# Patient Record
Sex: Male | Born: 1935 | Race: White | Hispanic: No | Marital: Married | Smoking: Never smoker
Health system: Southern US, Community
[De-identification: ages and names within clinical notes are randomized; demographics above are authoritative.]

## PROBLEM LIST (undated history)

## (undated) DIAGNOSIS — T8859XA Other complications of anesthesia, initial encounter: Secondary | ICD-10-CM

## (undated) DIAGNOSIS — M19019 Primary osteoarthritis, unspecified shoulder: Secondary | ICD-10-CM

## (undated) DIAGNOSIS — C801 Malignant (primary) neoplasm, unspecified: Secondary | ICD-10-CM

## (undated) DIAGNOSIS — I10 Essential (primary) hypertension: Secondary | ICD-10-CM

## (undated) DIAGNOSIS — K219 Gastro-esophageal reflux disease without esophagitis: Secondary | ICD-10-CM

## (undated) DIAGNOSIS — G459 Transient cerebral ischemic attack, unspecified: Secondary | ICD-10-CM

## (undated) DIAGNOSIS — T4145XA Adverse effect of unspecified anesthetic, initial encounter: Secondary | ICD-10-CM

## (undated) DIAGNOSIS — E785 Hyperlipidemia, unspecified: Secondary | ICD-10-CM

## (undated) HISTORY — PX: KNEE SURGERY: SHX244

## (undated) HISTORY — PX: CATARACT EXTRACTION: SUR2

## (undated) HISTORY — PX: PROSTATECTOMY: SHX69

---

## 2007-11-23 ENCOUNTER — Ambulatory Visit: Payer: Self-pay | Admitting: Cardiology

## 2011-07-26 ENCOUNTER — Encounter (HOSPITAL_COMMUNITY): Payer: Self-pay | Admitting: *Deleted

## 2011-07-26 ENCOUNTER — Emergency Department (HOSPITAL_COMMUNITY): Payer: Medicare Other

## 2011-07-26 ENCOUNTER — Emergency Department (HOSPITAL_COMMUNITY)
Admission: EM | Admit: 2011-07-26 | Discharge: 2011-07-27 | Disposition: A | Discharge status: Home / Self Care | Payer: Medicare Other | Attending: Emergency Medicine | Admitting: Emergency Medicine

## 2011-07-26 DIAGNOSIS — R531 Weakness: Secondary | ICD-10-CM

## 2011-07-26 DIAGNOSIS — G319 Degenerative disease of nervous system, unspecified: Secondary | ICD-10-CM | POA: Insufficient documentation

## 2011-07-26 DIAGNOSIS — R29898 Other symptoms and signs involving the musculoskeletal system: Secondary | ICD-10-CM | POA: Insufficient documentation

## 2011-07-26 DIAGNOSIS — H409 Unspecified glaucoma: Secondary | ICD-10-CM | POA: Insufficient documentation

## 2011-07-26 DIAGNOSIS — R5381 Other malaise: Secondary | ICD-10-CM | POA: Insufficient documentation

## 2011-07-26 DIAGNOSIS — R209 Unspecified disturbances of skin sensation: Secondary | ICD-10-CM | POA: Insufficient documentation

## 2011-07-26 DIAGNOSIS — Z7982 Long term (current) use of aspirin: Secondary | ICD-10-CM | POA: Insufficient documentation

## 2011-07-26 HISTORY — DX: Malignant (primary) neoplasm, unspecified: C80.1

## 2011-07-26 LAB — CBC
HCT: 43.9 % (ref 39.0–52.0)
Hemoglobin: 14.6 g/dL (ref 13.0–17.0)
MCH: 29.6 pg (ref 26.0–34.0)
MCHC: 33.3 g/dL (ref 30.0–36.0)
MCV: 89 fL (ref 78.0–100.0)
Platelets: 223 10*3/uL (ref 150–400)
RBC: 4.93 MIL/uL (ref 4.22–5.81)
RDW: 14.8 % (ref 11.5–15.5)
WBC: 7.4 10*3/uL (ref 4.0–10.5)

## 2011-07-26 LAB — TROPONIN I: Troponin I: <0.3 ng/mL (ref <0.30)

## 2011-07-26 LAB — DIFFERENTIAL
Basophils Absolute: 0 10*3/uL (ref 0.0–0.1)
Basophils Relative: 0 % (ref 0–1)
Eosinophils Absolute: 0.3 10*3/uL (ref 0.0–0.7)
Eosinophils Relative: 4 % (ref 0–5)
Lymphocytes Relative: 28 % (ref 12–46)
Lymphs Abs: 2.1 10*3/uL (ref 0.7–4.0)
Monocytes Absolute: 0.7 10*3/uL (ref 0.1–1.0)
Monocytes Relative: 10 % (ref 3–12)
Neutro Abs: 4.3 10*3/uL (ref 1.7–7.7)
Neutrophils Relative %: 58 % (ref 43–77)

## 2011-07-26 LAB — BASIC METABOLIC PANEL
BUN: 16 mg/dL (ref 6–23)
CO2: 26 mEq/L (ref 19–32)
Calcium: 8.9 mg/dL (ref 8.4–10.5)
Chloride: 103 mEq/L (ref 96–112)
Creatinine, Ser: 1.14 mg/dL (ref 0.50–1.35)
GFR calc Af Amer: 72 mL/min — ABNORMAL LOW (ref >90)
GFR calc non Af Amer: 62 mL/min — ABNORMAL LOW (ref >90)
Glucose, Bld: 100 mg/dL — ABNORMAL HIGH (ref 70–99)
Potassium: 4.3 mEq/L (ref 3.5–5.1)
Sodium: 138 mEq/L (ref 135–145)

## 2011-07-26 MED ORDER — SODIUM CHLORIDE 0.9 % IV SOLN: Freq: Once | INTRAVENOUS | Status: DC

## 2011-07-26 NOTE — ED Notes (Addendum)
Pt reports he was tired and weak yesterday, and this morning, approximately 8 am began feeling pain on right side of face and right arm very weak. Pt reports he took a regular strength ASA in addition to his normal 81 mg.  Pt went to an urgent care center and it was recommended he be evaluated here.  At present strength appears to be equal bilaterally, tongue midline, no facial droop noted. Able to track visually.  Denies slurred speech or confusion. None noted at present.

## 2011-07-26 NOTE — ED Provider Notes (Addendum)
History     CSN: [id number]  Arrival date & time 07/26/11  2146   First MD Initiated Contact with Patient 07/26/11 2258      Chief Complaint  Patient presents with  . Cerebrovascular Accident    (Consider location/radiation/quality/duration/timing/severity/associated sxs/prior treatment) HPI  Roger Sutton is a 76 y.o. male who presents to the Emergency Department complaining of generalized weakness since yesterday accompanied by right facial numbness today and right upper extremity tiredness today. Denies difficulty with speech or swallowing, stiff neck, inability to walk. Patient is right handed. Denies trouble holding items in his right hand. Denies fever, chills, nausea, vomiting. He has had a cough for several days.   PCp Dr. Merleen Milliner    Past Medical History  Diagnosis Date  . Cancer   . Glaucoma     Past Surgical History  Procedure Date  . Knee surgery     History reviewed. No pertinent family history.  History  Substance Use Topics  . Smoking status: Never Smoker   . Smokeless tobacco: Not on file  . Alcohol Use: No      Review of Systems  Constitutional: Negative for fever.       10 Systems reviewed and are negative for acute change except as noted in the HPI.  HENT: Negative for congestion.   Eyes: Negative for discharge and redness.  Respiratory: Negative for cough and shortness of breath.   Cardiovascular: Negative for chest pain.  Gastrointestinal: Negative for vomiting and abdominal pain.  Musculoskeletal: Negative for back pain.  Skin: Negative for rash.  Neurological: Positive for weakness and numbness. Negative for syncope and headaches.  Psychiatric/Behavioral:       No behavior change.    Allergies  Review of patient's allergies indicates no known allergies.  Home Medications   Current Outpatient Rx  Name Route Sig Dispense Refill  . ASPIRIN EC 81 MG PO TBEC Oral Take 81 mg by mouth daily.    Marland Kitchen ESOMEPRAZOLE MAGNESIUM 40  MG PO CPDR Oral Take 40 mg by mouth daily as needed. Acid reflux    . TIMOLOL HEMIHYDRATE 0.25 % OP SOLN  1-2 drops 2 (two) times daily.      BP 169/93  Pulse 96  Temp(Src) 98.2 F (36.8 C) (Oral)  Ht 5\' 8"  (1.727 m)  Wt 182 lb (82.555 kg)  BMI 27.67 kg/m2  SpO2 98%  Physical Exam  Nursing note and vitals reviewed. Constitutional: He is oriented to person, place, and time.       Awake, alert, nontoxic appearance.  HENT:  Head: Atraumatic.  Eyes: Right eye exhibits no discharge. Left eye exhibits no discharge.  Neck: Neck supple.  Pulmonary/Chest: Effort normal. He exhibits no tenderness.  Abdominal: Soft. There is no tenderness. There is no rebound.  Musculoskeletal: He exhibits no tenderness.       Baseline ROM, no obvious new focal weakness.  Neurological: He is alert and oriented to person, place, and time.       Mental status and motor strength appears baseline for patient and situation. Major CN grossly intact.  Strength 5/5 equal bilat UE's and LE's.  DTR 2/4 equal bilat UE's and LE's.  No gross sensory deficits.  Normal cerebellar testing bilat UE's and LE's.  No pronator drift.  Speech clear.  No facial droop.  No nystagmus.   Skin: Skin is warm and dry. No rash noted.  Psychiatric: He has a normal mood and affect.    ED Course  Procedures (including  critical care time)  Results for orders placed during the hospital encounter of 07/26/11  CBC      Component Value Range   WBC 7.4  4.0 - 10.5 (K/uL)   RBC 4.93  4.22 - 5.81 (MIL/uL)   Hemoglobin 14.6  13.0 - 17.0 (g/dL)   HCT 16.1  09.6 - 04.5 (%)   MCV 89.0  78.0 - 100.0 (fL)   MCH 29.6  26.0 - 34.0 (pg)   MCHC 33.3  30.0 - 36.0 (g/dL)   RDW 40.9  81.1 - 91.4 (%)   Platelets 223  150 - 400 (K/uL)  DIFFERENTIAL      Component Value Range   Neutrophils Relative 58  43 - 77 (%)   Neutro Abs 4.3  1.7 - 7.7 (K/uL)   Lymphocytes Relative 28  12 - 46 (%)   Lymphs Abs 2.1  0.7 - 4.0 (K/uL)   Monocytes Relative 10   3 - 12 (%)   Monocytes Absolute 0.7  0.1 - 1.0 (K/uL)   Eosinophils Relative 4  0 - 5 (%)   Eosinophils Absolute 0.3  0.0 - 0.7 (K/uL)   Basophils Relative 0  0 - 1 (%)   Basophils Absolute 0.0  0.0 - 0.1 (K/uL)  BASIC METABOLIC PANEL      Component Value Range   Sodium 138  135 - 145 (mEq/L)   Potassium 4.3  3.5 - 5.1 (mEq/L)   Chloride 103  96 - 112 (mEq/L)   CO2 26  19 - 32 (mEq/L)   Glucose, Bld 100 (*) 70 - 99 (mg/dL)   BUN 16  6 - 23 (mg/dL)   Creatinine, Ser 7.82  0.50 - 1.35 (mg/dL)   Calcium 8.9  8.4 - 95.6 (mg/dL)   GFR calc non Af Amer 62 (*) >90 (mL/min)   GFR calc Af Amer 72 (*) >90 (mL/min)  TROPONIN I      Component Value Range   Troponin I <0.30  <0.30 (ng/mL)   Ct Head Wo Contrast  07/26/2011  *RADIOLOGY REPORT*  Clinical Data: Right hand and arm weakness with right facial numbness.  CT HEAD WITHOUT CONTRAST  Technique:  Contiguous axial images were obtained from the base of the skull through the vertex without contrast.  Comparison: None.  Findings: There is no evidence for acute infarction, intracranial hemorrhage, mass lesion, hydrocephalus, or extra-axial fluid. Slight atrophy, likely age related.  No significant white matter disease. No CT signs of proximal vascular thrombosis.  Calvarium intact.  Mild vascular calcification.  Clear sinuses and mastoids.  IMPRESSION: Mild atrophy without focal intracranial abnormality.  No acute findings are observed.  Original Report Authenticated By: Elsie Stain, M.D.   Dg Chest Port 1 View  07/26/2011  *RADIOLOGY REPORT*  Clinical Data: Weakness and dizziness.  PORTABLE CHEST - 1 VIEW  Comparison: None.  Findings: Shallow inspiration.  Normal heart size and pulmonary vascularity.  No focal airspace consolidation or edema in the lungs.  No blunting of costophrenic angles.  No pneumothorax.  IMPRESSION: No evidence of active pulmonary disease.  Original Report Authenticated By: Marlon Pel, M.D.         MDM  Patient  with two-day history of generalized weakness, right-sided headache and right arm tiredness today. CT of head is negative for any acute process. Labs are unremarkable. Chest x-ray without any evidence of infection. Patient was ambulated in the department without difficulty. Have reviewed the results with the patient and his wife. He will  followup with his primary care physician.Pt stable in ED with no significant deterioration in condition.The patient appears reasonably screened and/or stabilized for discharge and I doubt any other medical condition or other [facility] requiring further screening, evaluation, or treatment in the ED at this time prior to discharge.  MDM Reviewed: nursing note and vitals Interpretation: labs, ECG, x-ray and CT scan           Nicoletta Dress. Colon Branch, MD 07/27/11 8413  Nicoletta Dress. Colon Branch, MD 07/27/11 [phone]

## 2011-07-27 NOTE — Discharge Instructions (Signed)
Your chest x-ray, lab work, CT head were all normal here tonight. There is no evidence of stroke. Continue your home medicines, especially her aspirin. If you should have more symptoms similar to the ones he came in with today, return to the emergency room for reevaluation or see your doctor.

## 2012-12-14 ENCOUNTER — Observation Stay (HOSPITAL_COMMUNITY): Payer: Medicare Other

## 2012-12-14 ENCOUNTER — Encounter (HOSPITAL_COMMUNITY): Payer: Self-pay | Admitting: Emergency Medicine

## 2012-12-14 ENCOUNTER — Emergency Department (HOSPITAL_COMMUNITY): Payer: Medicare Other

## 2012-12-14 ENCOUNTER — Inpatient Hospital Stay (HOSPITAL_COMMUNITY)
Admission: EM | Admit: 2012-12-14 | Discharge: 2012-12-15 | DRG: 313 | Disposition: A | Discharge status: Home / Self Care | Payer: Medicare Other | Attending: Internal Medicine | Admitting: Internal Medicine

## 2012-12-14 DIAGNOSIS — Z8249 Family history of ischemic heart disease and other diseases of the circulatory system: Secondary | ICD-10-CM

## 2012-12-14 DIAGNOSIS — Z9079 Acquired absence of other genital organ(s): Secondary | ICD-10-CM

## 2012-12-14 DIAGNOSIS — K4020 Bilateral inguinal hernia, without obstruction or gangrene, not specified as recurrent: Secondary | ICD-10-CM | POA: Diagnosis present

## 2012-12-14 DIAGNOSIS — K4090 Unilateral inguinal hernia, without obstruction or gangrene, not specified as recurrent: Secondary | ICD-10-CM

## 2012-12-14 DIAGNOSIS — R072 Precordial pain: Principal | ICD-10-CM | POA: Diagnosis present

## 2012-12-14 DIAGNOSIS — H409 Unspecified glaucoma: Secondary | ICD-10-CM | POA: Diagnosis present

## 2012-12-14 DIAGNOSIS — K219 Gastro-esophageal reflux disease without esophagitis: Secondary | ICD-10-CM

## 2012-12-14 DIAGNOSIS — Z8546 Personal history of malignant neoplasm of prostate: Secondary | ICD-10-CM

## 2012-12-14 DIAGNOSIS — F411 Generalized anxiety disorder: Secondary | ICD-10-CM | POA: Diagnosis present

## 2012-12-14 DIAGNOSIS — M19012 Primary osteoarthritis, left shoulder: Secondary | ICD-10-CM

## 2012-12-14 DIAGNOSIS — R079 Chest pain, unspecified: Secondary | ICD-10-CM

## 2012-12-14 DIAGNOSIS — M19019 Primary osteoarthritis, unspecified shoulder: Secondary | ICD-10-CM

## 2012-12-14 HISTORY — DX: Primary osteoarthritis, unspecified shoulder: M19.019

## 2012-12-14 LAB — BASIC METABOLIC PANEL
BUN: 14 mg/dL (ref 6–23)
CO2: 27 mEq/L (ref 19–32)
Calcium: 9.4 mg/dL (ref 8.4–10.5)
Chloride: 99 mEq/L (ref 96–112)
Creatinine, Ser: 1.28 mg/dL (ref 0.50–1.35)
GFR calc Af Amer: 61 mL/min — ABNORMAL LOW (ref >90)
GFR calc non Af Amer: 53 mL/min — ABNORMAL LOW (ref >90)
Glucose, Bld: 95 mg/dL (ref 70–99)
Potassium: 4 mEq/L (ref 3.5–5.1)
Sodium: 137 mEq/L (ref 135–145)

## 2012-12-14 LAB — CBC WITH DIFFERENTIAL/PLATELET
Basophils Absolute: 0 10*3/uL (ref 0.0–0.1)
Basophils Relative: 0 % (ref 0–1)
Eosinophils Absolute: 0.2 10*3/uL (ref 0.0–0.7)
Eosinophils Relative: 3 % (ref 0–5)
HCT: 49.2 % (ref 39.0–52.0)
Hemoglobin: 16.7 g/dL (ref 13.0–17.0)
Lymphocytes Relative: 32 % (ref 12–46)
Lymphs Abs: 2.1 10*3/uL (ref 0.7–4.0)
MCH: 30.1 pg (ref 26.0–34.0)
MCHC: 33.9 g/dL (ref 30.0–36.0)
MCV: 88.6 fL (ref 78.0–100.0)
Monocytes Absolute: 0.8 10*3/uL (ref 0.1–1.0)
Monocytes Relative: 11 % (ref 3–12)
Neutro Abs: 3.6 10*3/uL (ref 1.7–7.7)
Neutrophils Relative %: 53 % (ref 43–77)
Platelets: 277 10*3/uL (ref 150–400)
RBC: 5.55 MIL/uL (ref 4.22–5.81)
RDW: 14.3 % (ref 11.5–15.5)
WBC: 6.7 10*3/uL (ref 4.0–10.5)

## 2012-12-14 LAB — D-DIMER, QUANTITATIVE: D-Dimer, Quant: 0.58 ug/mL-FEU — ABNORMAL HIGH (ref 0.00–0.48)

## 2012-12-14 LAB — TROPONIN I
Troponin I: <0.3 ng/mL (ref <0.30)
Troponin I: <0.3 ng/mL (ref <0.30)

## 2012-12-14 MED ORDER — ENOXAPARIN SODIUM 40 MG/0.4ML ~~LOC~~ SOLN
40.0000 mg | SUBCUTANEOUS | Status: DC
  Administered 2012-12-14: 40 mg via SUBCUTANEOUS
  Filled 2012-12-14: qty 0.4

## 2012-12-14 MED ORDER — ALPRAZOLAM 0.25 MG PO TABS: 0.2500 mg | ORAL_TABLET | Freq: Three times a day (TID) | ORAL | Status: DC | PRN

## 2012-12-14 MED ORDER — TIMOLOL MALEATE 0.25 % OP SOLN
1.0000 [drp] | Freq: Two times a day (BID) | OPHTHALMIC | Status: DC
  Administered 2012-12-15: 1 [drp] via OPHTHALMIC
  Filled 2012-12-14: qty 5

## 2012-12-14 MED ORDER — SODIUM CHLORIDE 0.9 % IV BOLUS (SEPSIS)
250.0000 mL | Freq: Once | INTRAVENOUS | Status: AC
  Administered 2012-12-14: 250 mL via INTRAVENOUS

## 2012-12-14 MED ORDER — NITROGLYCERIN 0.4 MG SL SUBL
0.4000 mg | SUBLINGUAL_TABLET | SUBLINGUAL | Status: DC | PRN
  Administered 2012-12-14: 0.4 mg via SUBLINGUAL
  Filled 2012-12-14: qty 25

## 2012-12-14 MED ORDER — HYDROCODONE-ACETAMINOPHEN 5-325 MG PO TABS: 1.0000 | ORAL_TABLET | ORAL | Status: DC | PRN

## 2012-12-14 MED ORDER — SODIUM CHLORIDE 0.9 % IJ SOLN: 3.0000 mL | INTRAMUSCULAR | Status: DC | PRN

## 2012-12-14 MED ORDER — MORPHINE SULFATE 2 MG/ML IJ SOLN: 2.0000 mg | Freq: Once | INTRAMUSCULAR | Status: DC

## 2012-12-14 MED ORDER — SODIUM CHLORIDE 0.9 % IJ SOLN
3.0000 mL | Freq: Two times a day (BID) | INTRAMUSCULAR | Status: DC
  Administered 2012-12-14 – 2012-12-15 (×2): 3 mL via INTRAVENOUS

## 2012-12-14 MED ORDER — ALPRAZOLAM 0.5 MG PO TABS
0.2500 mg | ORAL_TABLET | Freq: Once | ORAL | Status: DC
  Administered 2012-12-14: 0.25 mg via ORAL
  Filled 2012-12-14: qty 1

## 2012-12-14 MED ORDER — PANTOPRAZOLE SODIUM 40 MG IV SOLR
40.0000 mg | INTRAVENOUS | Status: DC
  Administered 2012-12-14: 40 mg via INTRAVENOUS
  Filled 2012-12-14: qty 40

## 2012-12-14 MED ORDER — SODIUM CHLORIDE 0.9 % IV SOLN: 250.0000 mL | INTRAVENOUS | Status: DC | PRN

## 2012-12-14 MED ORDER — ONDANSETRON HCL 4 MG/2ML IJ SOLN
4.0000 mg | Freq: Once | INTRAMUSCULAR | Status: AC
  Administered 2012-12-14: 4 mg via INTRAVENOUS
  Filled 2012-12-14: qty 2

## 2012-12-14 MED ORDER — TIMOLOL HEMIHYDRATE 0.25 % OP SOLN: 1.0000 [drp] | Freq: Two times a day (BID) | OPHTHALMIC | Status: DC

## 2012-12-14 MED ORDER — SODIUM CHLORIDE 0.9 % IV SOLN
INTRAVENOUS | Status: DC
  Administered 2012-12-14: 19:00:00 via INTRAVENOUS

## 2012-12-14 MED ORDER — IOHEXOL 350 MG/ML SOLN
100.0000 mL | Freq: Once | INTRAVENOUS | Status: AC | PRN
  Administered 2012-12-14: 100 mL via INTRAVENOUS

## 2012-12-14 MED ORDER — ACETAMINOPHEN 500 MG PO TABS: 500.0000 mg | ORAL_TABLET | Freq: Four times a day (QID) | ORAL | Status: DC | PRN

## 2012-12-14 MED ORDER — ASPIRIN 81 MG PO CHEW
324.0000 mg | CHEWABLE_TABLET | Freq: Once | ORAL | Status: AC
  Administered 2012-12-14: 324 mg via ORAL
  Filled 2012-12-14: qty 4

## 2012-12-14 NOTE — ED Notes (Signed)
Pt reports was watching TV and started having tightness in left chest and tingling in left arm.  Also c/o pressure over r eye.  Denies any SOb or n/v.  Pt says for the past couple of days, has had a lot of gas.  C/O pain in lower abd off and on for several weeks.  Reports was diagnosed recently with inguinal hernia.

## 2012-12-14 NOTE — ED Notes (Signed)
Hospitalist at bedside 

## 2012-12-14 NOTE — ED Provider Notes (Signed)
CSN: [id number]     Arrival date & time 12/14/12  1728 History  This chart was scribed for Shelda Jakes, MD by Leone Payor, ED Scribe. This patient was seen in room APA04/APA04 and the patient's care was started 6:40 PM.    Chief Complaint  Patient presents with  . Chest Pain    The history is provided by the patient. No language interpreter was used.     HPI Comments: Roger Sutton is a 77 y.o. male who presents to the Emergency Department complaining of left substernal chest pain that began 2.5 hours ago. He reports this pain radiates to the left shoulder and right shoulder. Pt rates this pain as 4/10 currently, 6/10 at max. He has associated SOB, nausea, lightheadedness. Pt also reports having some visual disturbances that he describes as "flashes". Pt states he has been evaluated by his eye doctor. Pt denies similar symptoms in the past. Pt also has bilateral inguinal hernias, R>L which are also painful.   PCP in [facility] No cardiologist  Past Medical History  Diagnosis Date  . Glaucoma   . Cancer     prostate   Past Surgical History  Procedure Laterality Date  . Knee surgery    . Prostatectomy     No family history on file. History  Substance Use Topics  . Smoking status: Never Smoker   . Smokeless tobacco: Not on file  . Alcohol Use: No    Review of Systems  Constitutional: Positive for chills.  HENT: Positive for congestion and rhinorrhea.   Eyes: Positive for visual disturbance (described as flashes).  Respiratory: Positive for shortness of breath. Negative for cough.   Cardiovascular: Positive for chest pain. Negative for leg swelling.  Gastrointestinal: Positive for nausea. Negative for vomiting, abdominal pain and diarrhea.  Genitourinary:       Bilateral groin pain   Musculoskeletal: Positive for back pain.  Skin: Positive for rash.  Hematological: Does not bruise/bleed easily.  Psychiatric/Behavioral: Negative for confusion.  All other  systems reviewed and are negative.    Allergies  Review of patient's allergies indicates no known allergies.  Home Medications   Current Outpatient Rx  Name  Route  Sig  Dispense  Refill  . acetaminophen (TYLENOL) 500 MG tablet   Oral   Take 500 mg by mouth every 6 (six) hours as needed for pain.         Marland Kitchen aspirin EC 81 MG tablet   Oral   Take 81 mg by mouth daily.         . timolol (BETIMOL) 0.25 % ophthalmic solution      1-2 drops 2 (two) times daily.         . traMADol (ULTRAM) 50 MG tablet   Oral   Take 50 mg by mouth every 4 (four) hours as needed. For pain          BP 122/69  Pulse 75  Temp(Src) 97.4 F (36.3 C) (Oral)  Resp 11  SpO2 93% Physical Exam  Nursing note and vitals reviewed. Constitutional: He is oriented to person, place, and time. He appears well-developed and well-nourished.  HENT:  Head: Normocephalic and atraumatic.  Right Ear: External ear normal.  Left Ear: External ear normal.  Mouth/Throat: Oropharynx is clear and moist.  Eyes: Conjunctivae and EOM are normal. Pupils are equal, round, and reactive to light.  Neck: Normal range of motion. Neck supple.  Cardiovascular: Normal rate, regular rhythm and normal heart sounds.  Exam reveals no gallop and no friction rub.   No murmur heard. Pulmonary/Chest: Effort normal and breath sounds normal. No respiratory distress. He has no wheezes. He has no rales. He exhibits no tenderness.  Abdominal: Soft. Bowel sounds are normal. He exhibits no distension and no mass. There is no tenderness. There is no rebound and no guarding.  Musculoskeletal: Normal range of motion. He exhibits no edema.  Neurological: He is alert and oriented to person, place, and time.  Skin: Skin is warm and dry.  Psychiatric: He has a normal mood and affect.    ED Course  Procedures (including critical care time)  DIAGNOSTIC STUDIES:   COORDINATION OF CARE: 8:01 PM Discussed treatment plan with pt at bedside and  pt agreed to plan.   Labs Review Labs Reviewed  BASIC METABOLIC PANEL - Abnormal; Notable for the following:    GFR calc non Af Amer 53 (*)    GFR calc Af Amer 61 (*)    All other components within normal limits  CBC WITH DIFFERENTIAL  TROPONIN I   Results for orders placed during the hospital encounter of 12/14/12  CBC WITH DIFFERENTIAL      Result Value Range   WBC 6.7  4.0 - 10.5 K/uL   RBC 5.55  4.22 - 5.81 MIL/uL   Hemoglobin 16.7  13.0 - 17.0 g/dL   HCT 16.1  09.6 - 04.5 %   MCV 88.6  78.0 - 100.0 fL   MCH 30.1  26.0 - 34.0 pg   MCHC 33.9  30.0 - 36.0 g/dL   RDW 40.9  81.1 - 91.4 %   Platelets 277  150 - 400 K/uL   Neutrophils Relative % 53  43 - 77 %   Neutro Abs 3.6  1.7 - 7.7 K/uL   Lymphocytes Relative 32  12 - 46 %   Lymphs Abs 2.1  0.7 - 4.0 K/uL   Monocytes Relative 11  3 - 12 %   Monocytes Absolute 0.8  0.1 - 1.0 K/uL   Eosinophils Relative 3  0 - 5 %   Eosinophils Absolute 0.2  0.0 - 0.7 K/uL   Basophils Relative 0  0 - 1 %   Basophils Absolute 0.0  0.0 - 0.1 K/uL  BASIC METABOLIC PANEL      Result Value Range   Sodium 137  135 - 145 mEq/L   Potassium 4.0  3.5 - 5.1 mEq/L   Chloride 99  96 - 112 mEq/L   CO2 27  19 - 32 mEq/L   Glucose, Bld 95  70 - 99 mg/dL   BUN 14  6 - 23 mg/dL   Creatinine, Ser 7.82  0.50 - 1.35 mg/dL   Calcium 9.4  8.4 - 95.6 mg/dL   GFR calc non Af Amer 53 (*) >90 mL/min   GFR calc Af Amer 61 (*) >90 mL/min  TROPONIN I      Result Value Range   Troponin I <0.30  <0.30 ng/mL    Imaging Review Dg Chest 2 View  12/14/2012   CLINICAL DATA:  Chest pain since 1600 hr today, prostate cancer.  EXAM: CHEST  2 VIEW  COMPARISON:  07/26/2011  FINDINGS: Normal heart size, mediastinal contours, and pulmonary vascularity.  Emphysematous and minimal bronchitic changes compatible with COPD.  No acute infiltrate, pleural effusion, or pneumothorax.  No acute osseous findings.  IMPRESSION: No acute abnormalities.   Electronically Signed   By:  Angelyn Punt.D.  On: 12/14/2012 18:08    EKG Interpretation   None      Date: 12/14/2012  Rate: 73  Rhythm: normal sinus rhythm  QRS Axis: normal  Intervals: normal  ST/T Wave abnormalities: normal  Conduction Disutrbances:none  Narrative Interpretation:   Old EKG Reviewed: none available  Muse not working   MDM   1. Chest pain    Patient is pain-free after aspirin and sublingual nitroglycerin. Patient's initial cardiac markers negative EKG without any acute changes. However patient has had some new type of chest pain. Patient will require admission for formal rule out. No known cardiac history. No significant risk factors other than his age. Patient primary care doctors in [place]. No cardiologist.   Shelda Jakes, MD 12/14/12 2008

## 2012-12-14 NOTE — ED Notes (Signed)
MD at bedside. 

## 2012-12-14 NOTE — H&P (Signed)
PCP:   Arlina Robes, MD   Chief Complaint:  Chest pain  HPI: 77 year old male with no significant medical problems comes to the ED with chest pain the chart around 3 PM. The pain did radiate to the left arm and also has some pain and left shoulder. He says the pain resolved after he came to the ED. Patient was given nitroglycerin aspirin. He denies pain at this time. He denies shortness of breath no nausea vomiting or diarrhea. Patient has been feeling anxious over the past few days over anticipation for hernia surgery. He does have a history of reflux. Patient appears mildly anxious during the interview. He does not have a history of CAD no history of hyperlipidemia no hypertension. He does have family history of CAD his father died of MI. He denies smoking cigarettes.  Allergies:  No Known Allergies    Past Medical History  Diagnosis Date  . Glaucoma   . Cancer     prostate    Past Surgical History  Procedure Laterality Date  . Knee surgery    . Prostatectomy      Prior to Admission medications   Medication Sig Start Date End Date Taking? Authorizing Provider  acetaminophen (TYLENOL) 500 MG tablet Take 500 mg by mouth every 6 (six) hours as needed for pain.   Yes Historical Provider, MD  aspirin EC 81 MG tablet Take 81 mg by mouth daily.   Yes Historical Provider, MD  timolol (BETIMOL) 0.25 % ophthalmic solution 1-2 drops 2 (two) times daily.   Yes Historical Provider, MD  traMADol (ULTRAM) 50 MG tablet Take 50 mg by mouth every 4 (four) hours as needed. For pain 12/12/12   Historical Provider, MD    Social History:  reports that he has never smoked. He does not have any smokeless tobacco history on file. He reports that he does not drink alcohol or use illicit drugs.  No family history on file.   All the positives are listed in BOLD  Review of Systems:  HEENT: Headache, blurred vision, runny nose, sore throat Neck: Hypothyroidism,  hyperthyroidism,,lymphadenopathy Chest : Shortness of breath, history of COPD, Asthma Heart : Chest pain, history of coronary arterey disease GI:  Nausea, vomiting, diarrhea, constipation, GERD GU: Dysuria, urgency, frequency of urination, hematuria Neuro: Stroke, seizures, syncope Psych: Depression, anxiety, hallucinations   Physical Exam: Blood pressure 127/70, pulse 68, temperature 97.4 F (36.3 C), temperature source Oral, resp. rate 17, SpO2 100.00%. Constitutional:   Patient is a well-developed and well-nourished male* in no acute distress and cooperative with exam. Head: Normocephalic and atraumatic Mouth: Mucus membranes moist Eyes: PERRL, EOMI, conjunctivae normal Neck: Supple, No Thyromegaly Cardiovascular: RRR, S1 normal, S2 normal Pulmonary/Chest: CTAB, no wheezes, rales, or rhonchi.Mild tenderness in the left upper chest wall Abdominal: Soft. Non-tender, non-distended, bowel sounds are normal, no masses, organomegaly, or guarding present.  Neurological: A&O x3, Strenght is normal and symmetric bilaterally, cranial nerve II-XII are grossly intact, no focal motor deficit, sensory intact to light touch bilaterally.  Extremities :    Labs on Admission:  Results for orders placed during the hospital encounter of 12/14/12 (from the past 48 hour(s))  CBC WITH DIFFERENTIAL     Status: None   Collection Time    12/14/12  6:49 PM      Result Value Range   WBC 6.7  4.0 - 10.5 K/uL   RBC 5.55  4.22 - 5.81 MIL/uL   Hemoglobin 16.7  13.0 - 17.0 g/dL  HCT 49.2  39.0 - 52.0 %   MCV 88.6  78.0 - 100.0 fL   MCH 30.1  26.0 - 34.0 pg   MCHC 33.9  30.0 - 36.0 g/dL   RDW 16.1  09.6 - 04.5 %   Platelets 277  150 - 400 K/uL   Neutrophils Relative % 53  43 - 77 %   Neutro Abs 3.6  1.7 - 7.7 K/uL   Lymphocytes Relative 32  12 - 46 %   Lymphs Abs 2.1  0.7 - 4.0 K/uL   Monocytes Relative 11  3 - 12 %   Monocytes Absolute 0.8  0.1 - 1.0 K/uL   Eosinophils Relative 3  0 - 5 %    Eosinophils Absolute 0.2  0.0 - 0.7 K/uL   Basophils Relative 0  0 - 1 %   Basophils Absolute 0.0  0.0 - 0.1 K/uL  BASIC METABOLIC PANEL     Status: Abnormal   Collection Time    12/14/12  6:49 PM      Result Value Range   Sodium 137  135 - 145 mEq/L   Potassium 4.0  3.5 - 5.1 mEq/L   Chloride 99  96 - 112 mEq/L   CO2 27  19 - 32 mEq/L   Glucose, Bld 95  70 - 99 mg/dL   BUN 14  6 - 23 mg/dL   Creatinine, Ser 4.09  0.50 - 1.35 mg/dL   Calcium 9.4  8.4 - 81.1 mg/dL   GFR calc non Af Amer 53 (*) >90 mL/min   GFR calc Af Amer 61 (*) >90 mL/min   Comment: (NOTE)     The eGFR has been calculated using the CKD EPI equation.     This calculation has not been validated in all clinical situations.     eGFR's persistently <90 mL/min signify possible Chronic Kidney     Disease.  TROPONIN I     Status: None   Collection Time    12/14/12  6:49 PM      Result Value Range   Troponin I <0.30  <0.30 ng/mL   Comment:            Due to the release kinetics of cTnI,     a negative result within the first hours     of the onset of symptoms does not rule out     myocardial infarction with certainty.     If myocardial infarction is still suspected,     repeat the test at appropriate intervals.    Radiological Exams on Admission: Dg Chest 2 View  12/14/2012   CLINICAL DATA:  Chest pain since 1600 hr today, prostate cancer.  EXAM: CHEST  2 VIEW  COMPARISON:  07/26/2011  FINDINGS: Normal heart size, mediastinal contours, and pulmonary vascularity.  Emphysematous and minimal bronchitic changes compatible with COPD.  No acute infiltrate, pleural effusion, or pneumothorax.  No acute osseous findings.  IMPRESSION: No acute abnormalities.   Electronically Signed   By: Ulyses Southward M.D.   On: 12/14/2012 18:08    Assessment/Plan Principal Problem:   Chest pain Active Problems:   Inguinal hernia   GERD (gastroesophageal reflux disease)  Patient will be admitted in telemetry, will cycle cardiac enzymes,  though the pain seems to be very atypical. Chest x-ray and EKG are normal. Patient had cardiac stress test done many years ago which was normal. He'll be started on Xanax for anxiety and Protonix for GERD. We'll give him Lortab  when necessary for left shoulder pain. We'll also check d-dimer to rule out pulmonary embolism, as oxygen saturation was dropping to 80s while I was talking to the patient, though he did not appear short of breath. Will change the pulse ox meter and observe and also obtain d-dimer.  Code status: Patient is full code  Family discussion: Discussed with wife at bedside   Time Spent on Admission: 70 min  Shahid Flori S [facility] Pager: [phone] 12/14/2012, 9:21 PM  If 7PM-7AM, please contact night-coverage  [email or web address]  Password [email or web address]

## 2012-12-15 ENCOUNTER — Encounter (HOSPITAL_COMMUNITY): Payer: Self-pay | Admitting: Internal Medicine

## 2012-12-15 DIAGNOSIS — M19019 Primary osteoarthritis, unspecified shoulder: Secondary | ICD-10-CM

## 2012-12-15 DIAGNOSIS — K219 Gastro-esophageal reflux disease without esophagitis: Secondary | ICD-10-CM

## 2012-12-15 DIAGNOSIS — R079 Chest pain, unspecified: Secondary | ICD-10-CM

## 2012-12-15 HISTORY — DX: Primary osteoarthritis, unspecified shoulder: M19.019

## 2012-12-15 LAB — COMPREHENSIVE METABOLIC PANEL
ALT: 19 U/L (ref 0–53)
AST: 17 U/L (ref 0–37)
Albumin: 3.3 g/dL — ABNORMAL LOW (ref 3.5–5.2)
Alkaline Phosphatase: 65 U/L (ref 39–117)
BUN: 13 mg/dL (ref 6–23)
CO2: 27 mEq/L (ref 19–32)
Calcium: 8.5 mg/dL (ref 8.4–10.5)
Chloride: 103 mEq/L (ref 96–112)
Creatinine, Ser: 1.24 mg/dL (ref 0.50–1.35)
GFR calc Af Amer: 64 mL/min — ABNORMAL LOW (ref >90)
GFR calc non Af Amer: 55 mL/min — ABNORMAL LOW (ref >90)
Glucose, Bld: 98 mg/dL (ref 70–99)
Potassium: 4 mEq/L (ref 3.5–5.1)
Sodium: 136 mEq/L (ref 135–145)
Total Bilirubin: 0.6 mg/dL (ref 0.3–1.2)
Total Protein: 6 g/dL (ref 6.0–8.3)

## 2012-12-15 LAB — TSH: TSH: 1.116 u[IU]/mL (ref 0.350–4.500)

## 2012-12-15 LAB — CBC
HCT: 44.1 % (ref 39.0–52.0)
Hemoglobin: 15 g/dL (ref 13.0–17.0)
MCH: 29.7 pg (ref 26.0–34.0)
MCHC: 34 g/dL (ref 30.0–36.0)
MCV: 87.3 fL (ref 78.0–100.0)
Platelets: 227 10*3/uL (ref 150–400)
RBC: 5.05 MIL/uL (ref 4.22–5.81)
RDW: 14.2 % (ref 11.5–15.5)
WBC: 5.9 10*3/uL (ref 4.0–10.5)

## 2012-12-15 LAB — TROPONIN I
Troponin I: <0.3 ng/mL (ref <0.30)
Troponin I: <0.3 ng/mL (ref <0.30)

## 2012-12-15 MED ORDER — OMEPRAZOLE 40 MG PO CPDR: 40.0000 mg | DELAYED_RELEASE_CAPSULE | Freq: Every day | ORAL | Status: DC

## 2012-12-15 NOTE — Discharge Summary (Signed)
Physician Discharge Summary  Roger Sutton ZOX:[id number] DOB: [birth year] DOA: 12/14/2012  PCP: Roger Robes, MD  Admit date: 12/14/2012 Discharge date: 12/15/2012  Time spent: Greater than 25 minutes  Recommendations for Outpatient Follow-up:  1. An elective cardiac stress test can be considered, although, the patient ruled out for myocardial infarction; the CT angiogram did show coronary arthrosclerosis.  Discharge Diagnoses:  1. Chest pain. Myocardial infarction ruled out. CT angiogram was negative for pulmonary embolism. 2. Right inguinal hernia. 3. Gastroesophageal reflux disease. 4. Bilateral glenohumeral osteoarthritis, right greater than left.  Discharge Condition: Improved.  Diet recommendation: Heart healthy.  Filed Weights   12/14/12 2138  Weight: 81.7 kg (180 lb 1.9 oz)    History of present illness:  The patient is a 77 year old man with a history of prostate cancer and an inguinal hernia, who presented to the emergency department on 12/14/2012 with a chief complaint of chest pain. In the emergency department, he was afebrile and hemodynamically stable. His laboratory studies were unremarkable including a normal troponin I. His chest x-ray revealed no acute abnormalities. His EKG revealed normal sinus rhythm with no ST or T wave abnormalities. He was admitted for further evaluation and management.  Hospital Course:  The patient was started on supportive treatment with as needed analgesics and as needed antiemetics. Because there was a suspicion of some anxiety regarding his upcoming inguinal hernia repair, Xanax when necessary was ordered. He also has a history of gastric acid reflux and had been taking an over-the-counter medication periodically. Therefore, Protonix was ordered. For evaluation, a number of studies were ordered. His troponin I x4 was within normal limits. His d-dimer was slightly elevated. Therefore, CT angiogram of his chest was ordered. CT  of his chest revealed no pulmonary embolism or other acute intrathoracic findings. There was some evidence of coronary artery atherosclerosis and bilateral glenohumeral osteoarthritis. He was recently prescribed tramadol for his bilateral shoulder arthritis.  The patient remained hemodynamically stable and afebrile. He was chest pain-free at the time of discharge. The etiology of his chest pain was unclear, but could have been secondary to gastroesophageal reflux disease or radiation pain from left shoulder arthritis. He was prescribed Protonix at the time of discharge. He was instructed to continue tramadol as previously prescribed. He was also instructed to discuss a referral to a cardiologist for an elective cardiac stress test. He voiced understanding.  Procedures:  None  Consultations:  None  Discharge Exam: Filed Vitals:   12/15/12 0541  BP: 123/74  Pulse: 65  Temp: 97.8 F (36.6 C)  Resp: 20    General: Pleasant 77 year-old man sitting up in a chair, in no acute distress. Cardiovascular: S1, S2, with no murmurs rubs or gallops. Respiratory: Clear to auscultation bilaterally.  Discharge Instructions      Discharge Orders   Future Orders Complete By Expires   Diet - low sodium heart healthy  As directed    Increase activity slowly  As directed        Medication List         acetaminophen 500 MG tablet  Commonly known as:  TYLENOL  Take 500 mg by mouth every 6 (six) hours as needed for pain.     aspirin EC 81 MG tablet  Take 81 mg by mouth daily.     omeprazole 40 MG capsule  Commonly known as:  PRILOSEC  Take 1 capsule (40 mg total) by mouth daily. For gastric acid reflux.     timolol 0.25 %  ophthalmic solution  Commonly known as:  BETIMOL  1-2 drops 2 (two) times daily.     traMADol 50 MG tablet  Commonly known as:  ULTRAM  Take 50 mg by mouth every 4 (four) hours as needed. For pain       No Known Allergies Follow-up Information   Please follow  up. (Followup with your primary care physician in 2 weeks.)        The results of significant diagnostics from this hospitalization (including imaging, microbiology, ancillary and laboratory) are listed below for reference.    Significant Diagnostic Studies: Dg Chest 2 View  12/14/2012   CLINICAL DATA:  Chest pain since 1600 hr today, prostate cancer.  EXAM: CHEST  2 VIEW  COMPARISON:  07/26/2011  FINDINGS: Normal heart size, mediastinal contours, and pulmonary vascularity.  Emphysematous and minimal bronchitic changes compatible with COPD.  No acute infiltrate, pleural effusion, or pneumothorax.  No acute osseous findings.  IMPRESSION: No acute abnormalities.   Electronically Signed   By: Ulyses Southward M.D.   On: 12/14/2012 18:08   Ct Angio Chest Pe W/cm &/or Wo Cm  12/14/2012   CLINICAL DATA:  Shortness of breath and chest pain. Elevated D-dimer.  EXAM: CT ANGIOGRAPHY CHEST WITH CONTRAST  TECHNIQUE: Multidetector CT imaging of the chest was performed using the standard protocol during bolus administration of intravenous contrast. Multiplanar CT image reconstructions including MIPs were obtained to evaluate the vascular anatomy.  CONTRAST:  OMNIPAQUE IOHEXOL 350 MG/ML SOLN  COMPARISON:  None.  FINDINGS: THORACIC INLET/BODY WALL:  No acute abnormality.  MEDIASTINUM:  Normal heart size. No pericardial effusion. Coronary artery atherosclerosis No acute vascular abnormality. No adenopathy.  LUNG WINDOWS:  No consolidation or edema. Cluster of partly calcified micronodules in the right lower lobe along the major fissure, likely from remote infection. Nonspecific subpleural reticulation at the bases, favor atelectasis.  UPPER ABDOMEN:  Geographic steatosis in the central liver, without evident cause.  OSSEOUS:  Notably advanced glenohumeral osteoarthritis, right greater than left.  Review of the MIP images confirms the above findings.  IMPRESSION: 1. Negative for pulmonary embolism or other acute  intrathoracic finding. 2. Coronary artery atherosclerosis.   Electronically Signed   By: Tiburcio Pea M.D.   On: 12/14/2012 23:33    Microbiology: No results found for this or any previous visit (from the past 240 hour(s)).   Labs: Basic Metabolic Panel:  Recent Labs Lab 12/14/12 1849 12/15/12 0350  NA 137 136  K 4.0 4.0  CL 99 103  CO2 27 27  GLUCOSE 95 98  BUN 14 13  CREATININE 1.28 1.24  CALCIUM 9.4 8.5   Liver Function Tests:  Recent Labs Lab 12/15/12 0350  AST 17  ALT 19  ALKPHOS 65  BILITOT 0.6  PROT 6.0  ALBUMIN 3.3*   No results found for this basename: LIPASE, AMYLASE,  in the last 168 hours No results found for this basename: AMMONIA,  in the last 168 hours CBC:  Recent Labs Lab 12/14/12 1849 12/15/12 0350  WBC 6.7 5.9  NEUTROABS 3.6  --   HGB 16.7 15.0  HCT 49.2 44.1  MCV 88.6 87.3  PLT 277 227   Cardiac Enzymes:  Recent Labs Lab 12/14/12 1849 12/14/12 2155 12/15/12 0350 12/15/12 0932  TROPONINI <0.30 <0.30 <0.30 <0.30   BNP: BNP (last 3 results) No results found for this basename: PROBNP,  in the last 8760 hours CBG: No results found for this basename: GLUCAP,  in the last  168 hours     Signed:  Dylanie Quesenberry  [facility] 12/15/2012, 10:40 AM

## 2012-12-15 NOTE — Progress Notes (Signed)
Prescriptions given,states understanding of discharge 

## 2012-12-15 NOTE — Progress Notes (Signed)
UR Chart Review Completed  

## 2013-02-01 ENCOUNTER — Emergency Department (HOSPITAL_COMMUNITY)
Admission: EM | Admit: 2013-02-01 | Discharge: 2013-02-02 | Disposition: A | Discharge status: Home / Self Care | Payer: Medicare Other | Attending: Emergency Medicine | Admitting: Emergency Medicine

## 2013-02-01 ENCOUNTER — Encounter (HOSPITAL_COMMUNITY): Payer: Self-pay | Admitting: Emergency Medicine

## 2013-02-01 DIAGNOSIS — Z7982 Long term (current) use of aspirin: Secondary | ICD-10-CM | POA: Insufficient documentation

## 2013-02-01 DIAGNOSIS — Z8546 Personal history of malignant neoplasm of prostate: Secondary | ICD-10-CM | POA: Insufficient documentation

## 2013-02-01 DIAGNOSIS — Z79899 Other long term (current) drug therapy: Secondary | ICD-10-CM | POA: Insufficient documentation

## 2013-02-01 DIAGNOSIS — G4700 Insomnia, unspecified: Secondary | ICD-10-CM

## 2013-02-01 DIAGNOSIS — H409 Unspecified glaucoma: Secondary | ICD-10-CM | POA: Insufficient documentation

## 2013-02-01 DIAGNOSIS — R079 Chest pain, unspecified: Secondary | ICD-10-CM | POA: Insufficient documentation

## 2013-02-01 DIAGNOSIS — R0789 Other chest pain: Secondary | ICD-10-CM

## 2013-02-01 DIAGNOSIS — R141 Gas pain: Secondary | ICD-10-CM | POA: Insufficient documentation

## 2013-02-01 DIAGNOSIS — R61 Generalized hyperhidrosis: Secondary | ICD-10-CM | POA: Insufficient documentation

## 2013-02-01 DIAGNOSIS — Z8739 Personal history of other diseases of the musculoskeletal system and connective tissue: Secondary | ICD-10-CM | POA: Insufficient documentation

## 2013-02-01 DIAGNOSIS — R142 Eructation: Secondary | ICD-10-CM

## 2013-02-01 DIAGNOSIS — R682 Dry mouth, unspecified: Secondary | ICD-10-CM

## 2013-02-01 DIAGNOSIS — K117 Disturbances of salivary secretion: Secondary | ICD-10-CM | POA: Insufficient documentation

## 2013-02-01 NOTE — ED Notes (Signed)
Patient states he was watching TV and began having dry mouth and couldn't make saliva.  Patient had shortness of breath and feels like something is in his throat.

## 2013-02-02 ENCOUNTER — Emergency Department (HOSPITAL_COMMUNITY): Payer: Medicare Other

## 2013-02-02 LAB — URINE MICROSCOPIC-ADD ON

## 2013-02-02 LAB — COMPREHENSIVE METABOLIC PANEL
ALT: 23 U/L (ref 0–53)
AST: 24 U/L (ref 0–37)
Albumin: 3.9 g/dL (ref 3.5–5.2)
Alkaline Phosphatase: 73 U/L (ref 39–117)
BUN: 17 mg/dL (ref 6–23)
CO2: 24 mEq/L (ref 19–32)
Calcium: 9 mg/dL (ref 8.4–10.5)
Chloride: 99 mEq/L (ref 96–112)
Creatinine, Ser: 1.32 mg/dL (ref 0.50–1.35)
GFR calc Af Amer: 59 mL/min — ABNORMAL LOW (ref >90)
GFR calc non Af Amer: 51 mL/min — ABNORMAL LOW (ref >90)
Glucose, Bld: 129 mg/dL — ABNORMAL HIGH (ref 70–99)
Potassium: 3.9 mEq/L (ref 3.5–5.1)
Sodium: 134 mEq/L — ABNORMAL LOW (ref 135–145)
Total Bilirubin: 0.6 mg/dL (ref 0.3–1.2)
Total Protein: 6.8 g/dL (ref 6.0–8.3)

## 2013-02-02 LAB — URINALYSIS, ROUTINE W REFLEX MICROSCOPIC
Bilirubin Urine: NEGATIVE
Glucose, UA: NEGATIVE mg/dL
Ketones, ur: NEGATIVE mg/dL
Leukocytes, UA: NEGATIVE
Nitrite: NEGATIVE
Protein, ur: NEGATIVE mg/dL
Specific Gravity, Urine: 1.025 (ref 1.005–1.030)
Urobilinogen, UA: 0.2 mg/dL (ref 0.0–1.0)
pH: 6 (ref 5.0–8.0)

## 2013-02-02 LAB — CBC WITH DIFFERENTIAL/PLATELET
Basophils Absolute: 0 10*3/uL (ref 0.0–0.1)
Basophils Relative: 0 % (ref 0–1)
Eosinophils Absolute: 0.2 10*3/uL (ref 0.0–0.7)
Eosinophils Relative: 2 % (ref 0–5)
HCT: 45.9 % (ref 39.0–52.0)
Hemoglobin: 15.6 g/dL (ref 13.0–17.0)
Lymphocytes Relative: 25 % (ref 12–46)
Lymphs Abs: 1.8 10*3/uL (ref 0.7–4.0)
MCH: 30 pg (ref 26.0–34.0)
MCHC: 34 g/dL (ref 30.0–36.0)
MCV: 88.3 fL (ref 78.0–100.0)
Monocytes Absolute: 0.8 10*3/uL (ref 0.1–1.0)
Monocytes Relative: 12 % (ref 3–12)
Neutro Abs: 4.3 10*3/uL (ref 1.7–7.7)
Neutrophils Relative %: 61 % (ref 43–77)
Platelets: 253 10*3/uL (ref 150–400)
RBC: 5.2 MIL/uL (ref 4.22–5.81)
RDW: 14 % (ref 11.5–15.5)
WBC: 7.1 10*3/uL (ref 4.0–10.5)

## 2013-02-02 LAB — TROPONIN I
Troponin I: <0.3 ng/mL (ref <0.30)
Troponin I: <0.3 ng/mL (ref <0.30)

## 2013-02-02 MED ORDER — OMEPRAZOLE 40 MG PO CPDR: DELAYED_RELEASE_CAPSULE | ORAL | Status: DC

## 2013-02-02 NOTE — ED Provider Notes (Signed)
CSN: [id number]     Arrival date & time 02/01/13  2349 History   First MD Initiated Contact with Patient 02/02/13 0000     Chief Complaint  Patient presents with  . Shortness of Breath   (Consider location/radiation/quality/duration/timing/severity/associated sxs/prior Treatment) HPI  Patient reports on October 21 he had a right inguinal hernia repair done by a Dr. Sharon Seller in [place]. He reports since then he has been having anxiety. He also has been having trouble sleeping. He also has been having frequent burping since the surgery. He states he feels like his ankles have weights on them. He feels lightheaded. He reports he has been feeling dry in his mouth. He had reported the difficulty sleeping to his doctor and he was advised to take Benadryl over-the-counter. He started out taking 25 mg a day which was not helping and the past 2 nights he has taken 50 mg a day. They also report they have gas heat in the house. He states tonight about 10:30 PM he was not eating anything. He states he started feeling like his mouth was dry and he felt like he had something stuck in his throat. He started feeling short of breath but denied chest pain although he did state he had a heaviness in his chest off and on that he thought was indigestion that he has had since his surgery. He denies anything that makes the indigestion discomfort worse such as exercise or eating. He states he had a mild cough when he had the dryness. He states he did feel a little diaphoretic tonight. He states he had the indigestion discomfort and indicates his anterior chest tonight that lasted less than 10 minutes. He states he also had some soreness in his right arm for while. He denies cough, fever, sore throat. He is not having difficulty drinking liquids or eating solids. He states he feels like his heart races when he gets nervous.  Family history he states his father died at age 68 from stroke and MI.   PCP Dr  Merleen Milliner.  Past Medical History  Diagnosis Date  . Glaucoma   . Cancer     prostate  . Osteoarthritis of glenohumeral joint 12/15/2012   Past Surgical History  Procedure Laterality Date  . Knee surgery    . Prostatectomy    . Hernia repair     No family history on file. History  Substance Use Topics  . Smoking status: Never Smoker   . Smokeless tobacco: Not on file  . Alcohol Use: No  lives at home Lives with spouse  Review of Systems  All other systems reviewed and are negative.    Allergies  Review of patient's allergies indicates no known allergies.  Home Medications   Current Outpatient Rx  Name  Route  Sig  Dispense  Refill  . acetaminophen (TYLENOL) 500 MG tablet   Oral   Take 500 mg by mouth every 6 (six) hours as needed for pain.         Marland Kitchen aspirin EC 81 MG tablet   Oral   Take 81 mg by mouth daily.         Marland Kitchen omeprazole (PRILOSEC) 40 MG capsule   Oral   Take 1 capsule (40 mg total) by mouth daily. For gastric acid reflux.   30 capsule   2   . timolol (BETIMOL) 0.25 % ophthalmic solution      1-2 drops 2 (two) times daily.         Marland Kitchen  traMADol (ULTRAM) 50 MG tablet   Oral   Take 50 mg by mouth every 4 (four) hours as needed. For pain          Benadryl 50 mg qhs x 2 nights, 25 mg at night before    ED Triage Vitals  Enc Vitals Group     BP 02/01/13 2358 152/81 mmHg     Pulse Rate 02/01/13 2358 94     Resp 02/01/13 2358 22     Temp 02/01/13 2358 98 F (36.7 C)     Temp src 02/01/13 2358 Oral     SpO2 02/01/13 2358 98 %     Weight 02/01/13 2358 167 lb (75.751 kg)     Height 02/01/13 2358 5\' 8"  (1.727 m)     Head Cir --      Peak Flow --      Pain Score --      Pain Loc --      Pain Edu? --      Excl. in GC? --      Vital signs normal except hypertension  Orthostatic VS are shows BP drops from 132 systolic to 100 on standing and his pulse rises from 76-85.   Physical Exam  Nursing note and vitals  reviewed. Constitutional: He is oriented to person, place, and time. He appears well-developed and well-nourished.  Non-toxic appearance. He does not appear ill. No distress.  HENT:  Head: Normocephalic and atraumatic.  Right Ear: External ear normal.  Left Ear: External ear normal.  Nose: Nose normal. No mucosal edema or rhinorrhea.  Mouth/Throat: Oropharynx is clear and moist and mucous membranes are normal. No dental abscesses or uvula swelling.  Eyes: Conjunctivae and EOM are normal. Pupils are equal, round, and reactive to light.  Neck: Normal range of motion and full passive range of motion without pain. Neck supple.  Cardiovascular: Normal rate, regular rhythm and normal heart sounds.  Exam reveals no gallop and no friction rub.   No murmur heard. Pulmonary/Chest: Effort normal and breath sounds normal. No respiratory distress. He has no wheezes. He has no rhonchi. He has no rales. He exhibits no tenderness and no crepitus.  Abdominal: Soft. Normal appearance and bowel sounds are normal. He exhibits no distension. There is no tenderness. There is no rebound and no guarding.  Well healed surgical incision in the right inguinal area  Musculoskeletal: Normal range of motion. He exhibits no edema and no tenderness.  Moves all extremities well.   Neurological: He is alert and oriented to person, place, and time. He has normal strength. No cranial nerve deficit.  Skin: Skin is warm, dry and intact. No rash noted. No erythema. No pallor.  Psychiatric: He has a normal mood and affect. His speech is normal and behavior is normal. His mood appears not anxious.    ED Course  Procedures (including critical care time) Medications - No data to display  Patient has several reasons to have dry mouth including having gas heat in his house for the winter months, and taking Benadryl for sleep aid for his insomnia that he's had since his surgery. The Benadryl will also make him more anxious and feel  short of breath. He was advised to take melatonin over-the-counter and then discuss his insomnia further with his physician. He also has been complaining of increased belching and he was advised to increase his Prilosec from one tablet once a day to one tablet twice a day for 2 weeks then back  down to once a day. He did have some "indigestion" atypical chest discomfort that should be discussed further with his primary care doctor, possibly considering a stress test although patient recently had general anesthesia without complications.   Labs Review Results for orders placed during the hospital encounter of 02/01/13  CBC WITH DIFFERENTIAL      Result Value Range   WBC 7.1  4.0 - 10.5 K/uL   RBC 5.20  4.22 - 5.81 MIL/uL   Hemoglobin 15.6  13.0 - 17.0 g/dL   HCT 40.9  81.1 - 91.4 %   MCV 88.3  78.0 - 100.0 fL   MCH 30.0  26.0 - 34.0 pg   MCHC 34.0  30.0 - 36.0 g/dL   RDW 78.2  95.6 - 21.3 %   Platelets 253  150 - 400 K/uL   Neutrophils Relative % 61  43 - 77 %   Neutro Abs 4.3  1.7 - 7.7 K/uL   Lymphocytes Relative 25  12 - 46 %   Lymphs Abs 1.8  0.7 - 4.0 K/uL   Monocytes Relative 12  3 - 12 %   Monocytes Absolute 0.8  0.1 - 1.0 K/uL   Eosinophils Relative 2  0 - 5 %   Eosinophils Absolute 0.2  0.0 - 0.7 K/uL   Basophils Relative 0  0 - 1 %   Basophils Absolute 0.0  0.0 - 0.1 K/uL  COMPREHENSIVE METABOLIC PANEL      Result Value Range   Sodium 134 (*) 135 - 145 mEq/L   Potassium 3.9  3.5 - 5.1 mEq/L   Chloride 99  96 - 112 mEq/L   CO2 24  19 - 32 mEq/L   Glucose, Bld 129 (*) 70 - 99 mg/dL   BUN 17  6 - 23 mg/dL   Creatinine, Ser 0.86  0.50 - 1.35 mg/dL   Calcium 9.0  8.4 - 57.8 mg/dL   Total Protein 6.8  6.0 - 8.3 g/dL   Albumin 3.9  3.5 - 5.2 g/dL   AST 24  0 - 37 U/L   ALT 23  0 - 53 U/L   Alkaline Phosphatase 73  39 - 117 U/L   Total Bilirubin 0.6  0.3 - 1.2 mg/dL   GFR calc non Af Amer 51 (*) >90 mL/min   GFR calc Af Amer 59 (*) >90 mL/min  TROPONIN I      Result Value  Range   Troponin I <0.30  <0.30 ng/mL  URINALYSIS, ROUTINE W REFLEX MICROSCOPIC      Result Value Range   Color, Urine YELLOW  YELLOW   APPearance CLEAR  CLEAR   Specific Gravity, Urine 1.025  1.005 - 1.030   pH 6.0  5.0 - 8.0   Glucose, UA NEGATIVE  NEGATIVE mg/dL   Hgb urine dipstick TRACE (*) NEGATIVE   Bilirubin Urine NEGATIVE  NEGATIVE   Ketones, ur NEGATIVE  NEGATIVE mg/dL   Protein, ur NEGATIVE  NEGATIVE mg/dL   Urobilinogen, UA 0.2  0.0 - 1.0 mg/dL   Nitrite NEGATIVE  NEGATIVE   Leukocytes, UA NEGATIVE  NEGATIVE  URINE MICROSCOPIC-ADD ON      Result Value Range   Squamous Epithelial / LPF RARE  RARE   WBC, UA 0-2  <3 WBC/hpf   RBC / HPF 0-2  <3 RBC/hpf   Bacteria, UA FEW (*) RARE  TROPONIN I      Result Value Range   Troponin I <0.30  <0.30 ng/mL  Laboratory interpretation all normal   Imaging Review Dg Chest 2 View  02/02/2013   CLINICAL DATA:  Short of breath.  History of prostate cancer.  EXAM: CHEST  2 VIEW  COMPARISON:  12/14/2012  FINDINGS: Mild hyperinflation. Midline trachea. Normal heart size and mediastinal contours. No pleural effusion or pneumothorax. Clear lungs.  IMPRESSION: Hyperinflation, without acute disease.   Electronically Signed   By: Jeronimo Greaves M.D.   On: 02/02/2013 00:51    EKG Interpretation    Date/Time:  Thursday February 02 2013 00:02:22 EST Ventricular Rate:  85 PR Interval:  150 QRS Duration: 92 QT Interval:  376 QTC Calculation: 447 R Axis:   -6 Text Interpretation:  Normal sinus rhythm Normal ECG When compared with ECG of 14-Dec-2012 17:34, No significant change was found Confirmed by Trenise Turay  MD-I, Dasani Crear ([id number]) on 02/02/2013 2:46:26 AM            MDM   1. Dry mouth   2. Atypical chest pain   3. Burping   4. Insomnia     Discharge Medication List as of 02/02/2013  3:12 AM    START taking these medications   Details  !! omeprazole (PRILOSEC) 40 MG capsule Take 1 po BID x 2 weeks then once a day, Print     !! -  Potential duplicate medications found. Please discuss with provider.      Plan discharge   Devoria Albe, MD, Franz Dell, MD 02/02/13 [phone]

## 2013-02-02 NOTE — ED Notes (Signed)
Pt resting quietly. No distress noted.  Tolerating p.o fluids with no difficulty.

## 2013-02-02 NOTE — ED Notes (Signed)
Pt denies any SOB at present time.  Denies pain.  VS stable.  No distress noted.  Pt requesting p.o fluids.

## 2013-03-02 HISTORY — PX: HERNIA REPAIR: SHX51

## 2014-09-25 NOTE — H&P (Signed)
  NTS SOAP Note  Vital Signs:  Vitals as of: 4/97/0263: Systolic 785: Diastolic 87: Heart Rate 73: Temp 97.79F: Height 80ft 8in: Weight 172Lbs 0 Ounces: BMI 26.15  BMI : 26.15 kg/m2  Subjective: This 79 year old male presents for of a left inguinal hernia.  Has been present for some time, but is increasing in size and causing him difficulty.  Made worse with prolonged sitting or straining.  s/p RIH last year.  Review of Symptoms:  Constitutional:unremarkable   Head:unremarkable Eyes:unremarkable   Nose/Mouth/Throat:unremarkable Cardiovascular:  unremarkable Respiratory:unremarkable Gastrointestindyspepsia Genitourinary:frequency Musculoskeletal:unremarkable Skin:unremarkable Hematolgic/Lymphatic:unremarkable   Allergic/Immunologic:unremarkable   Past Medical History:  Reviewed  Past Medical History  Surgical History: prostate surgery, RIH, left knee surgery Medical Problems: prostate cancer Allergies: nkda Medications: baby asa, omeprazole, eye drops   Social History:Reviewed  Social History  Preferred Language: English Race:  White Ethnicity: Not Hispanic / Latino Age: 55 year Marital Status:  M Alcohol: no   Smoking Status: Never smoker reviewed on 09/25/2014 Functional Status reviewed on 09/25/2014 ------------------------------------------------ Bathing: Normal Cooking: Normal Dressing: Normal Driving: Normal Eating: Normal Managing Meds: Normal Oral Care: Normal Shopping: Normal Toileting: Normal Transferring: Normal Walking: Normal Cognitive Status reviewed on 09/25/2014 ------------------------------------------------ Attention: Normal Decision Making: Normal Language: Normal Memory: Normal Motor: Normal Perception: Normal Problem Solving: Normal Visual and Spatial: Normal   Family History:Reviewed  Family Health History Mother, Deceased; Healthy;  Father, Deceased; Heart disease;     Objective  Information: General:Well appearing, well nourished in no distress. Heart:RRR, no murmur Lungs:  CTA bilaterally, no wheezes, rhonchi, rales.  Breathing unlabored. Abdomen:Soft, NT/ND, no HSM, no masses.  Reducible left inguinal hernia noted.  Surgical scar in right groin. YI:[id number]    Assessment:Left inguinal hernia  Diagnoses: 550.90  K40.90 Inguinal hernia (Unilateral inguinal hernia, without obstruction or gangrene, not specified as recurrent)  Procedures: 76720 - OFFICE OUTPATIENT NEW 30 MINUTES    Plan:  Scheduled for left inguinal herniorrhaphy with mesh on 10/08/14.   Patient Education:Alternative treatments to surgery were discussed with patient (and family).  Risks and benefits  of procedure including bleeding, infection, mesh use, and recurrence of the hernia were fully explained to the patient (and family) who gave informed consent. Patient/family questions were addressed.  Follow-up:Pending Surgery

## 2014-09-27 NOTE — Patient Instructions (Signed)
Roger Sutton  09/27/2014     @PREFPERIOPPHARMACY @   Your procedure is scheduled on 10/08/2014.  Report to [facility] at 9:30 A.M.  Call this number if you have problems the morning of surgery:  [phone]   Remember:  Do not eat food or drink liquids after midnight.  Take these medicines the morning of surgery with A SIP OF WATER Prilosec, tramadol if needed   Do not wear jewelry, make-up or nail polish.  Do not wear lotions, powders, or perfumes.  You may wear deodorant.  Do not shave 48 hours prior to surgery.  Men may shave face and neck.  Do not bring valuables to the hospital.  [facility] is not responsible for any belongings or valuables.  Contacts, dentures or bridgework may not be worn into surgery.  Leave your suitcase in the car.  After surgery it may be brought to your room.  For patients admitted to the hospital, discharge time will be determined by your treatment team.  Patients discharged the day of surgery will not be allowed to drive home.    Please read over the following fact sheets that you were given. Surgical Site Infection Prevention and Anesthesia Post-op Instructions     PATIENT INSTRUCTIONS POST-ANESTHESIA  IMMEDIATELY FOLLOWING SURGERY:  Do not drive or operate machinery for the first twenty four hours after surgery.  Do not make any important decisions for twenty four hours after surgery or while taking narcotic pain medications or sedatives.  If you develop intractable nausea and vomiting or a severe headache please notify your doctor immediately.  FOLLOW-UP:  Please make an appointment with your surgeon as instructed. You do not need to follow up with anesthesia unless specifically instructed to do so.  WOUND CARE INSTRUCTIONS (if applicable):  Keep a dry clean dressing on the anesthesia/puncture wound site if there is drainage.  Once the wound has quit draining you may leave it open to air.  Generally you should leave the bandage intact  for twenty four hours unless there is drainage.  If the epidural site drains for more than 36-48 hours please call the anesthesia department.  QUESTIONS?:  Please feel free to call your physician or the hospital operator if you have any questions, and they will be happy to assist you.      Inguinal Hernia, Adult Muscles help keep everything in the body in its proper place. But if a weak spot in the muscles develops, something can poke through. That is called a hernia. When this happens in the lower part of the belly (abdomen), it is called an inguinal hernia. (It takes its name from a part of the body in this region called the inguinal canal.) A weak spot in the wall of muscles lets some fat or part of the small intestine bulge through. An inguinal hernia can develop at any age. Men get them more often than women. CAUSES  In adults, an inguinal hernia develops over time.  It can be triggered by:  Suddenly straining the muscles of the lower abdomen.  Lifting heavy objects.  Straining to have a bowel movement. Difficult bowel movements (constipation) can lead to this.  Constant coughing. This may be caused by smoking or lung disease.  Being overweight.  Being pregnant.  Working at a job that requires long periods of standing or heavy lifting.  Having had an inguinal hernia before. One type can be an emergency situation. It is called a strangulated inguinal hernia. It develops  if part of the small intestine slips through the weak spot and cannot get back into the abdomen. The blood supply can be cut off. If that happens, part of the intestine may die. This situation requires emergency surgery. SYMPTOMS  Often, a small inguinal hernia has no symptoms. It is found when a healthcare provider does a physical exam. Larger hernias usually have symptoms.   In adults, symptoms may include:  A lump in the groin. This is easier to see when the person is standing. It might disappear when lying  down.  In men, a lump in the scrotum.  Pain or burning in the groin. This occurs especially when lifting, straining or coughing.  A dull ache or feeling of pressure in the groin.  Signs of a strangulated hernia can include:  A bulge in the groin that becomes very painful and tender to the touch.  A bulge that turns red or purple.  Fever, nausea and vomiting.  Inability to have a bowel movement or to pass gas. DIAGNOSIS  To decide if you have an inguinal hernia, a healthcare provider will probably do a physical examination.  This will include asking questions about any symptoms you have noticed.  The healthcare provider might feel the groin area and ask you to cough. If an inguinal hernia is felt, the healthcare provider may try to slide it back into the abdomen.  Usually no other tests are needed. TREATMENT  Treatments can vary. The size of the hernia makes a difference. Options include:  Watchful waiting. This is often suggested if the hernia is small and you have had no symptoms.  No medical procedure will be done unless symptoms develop.  You will need to watch closely for symptoms. If any occur, contact your healthcare provider right away.  Surgery. This is used if the hernia is larger or you have symptoms.  Open surgery. This is usually an outpatient procedure (you will not stay overnight in a hospital). An cut (incision) is made through the skin in the groin. The hernia is put back inside the abdomen. The weak area in the muscles is then repaired by herniorrhaphy or hernioplasty. Herniorrhaphy: in this type of surgery, the weak muscles are sewn back together. Hernioplasty: a patch or mesh is used to close the weak area in the abdominal wall.  Laparoscopy. In this procedure, a surgeon makes small incisions. A thin tube with a tiny video camera (called a laparoscope) is put into the abdomen. The surgeon repairs the hernia with mesh by looking with the video camera and using  two long instruments. HOME CARE INSTRUCTIONS   After surgery to repair an inguinal hernia:  You will need to take pain medicine prescribed by your healthcare provider. Follow all directions carefully.  You will need to take care of the wound from the incision.  Your activity will be restricted for awhile. This will probably include no heavy lifting for several weeks. You also should not do anything too active for a few weeks. When you can return to work will depend on the type of job that you have.  During "watchful waiting" periods, you should:  Maintain a healthy weight.  Eat a diet high in fiber (fruits, vegetables and whole grains).  Drink plenty of fluids to avoid constipation. This means drinking enough water and other liquids to keep your urine clear or pale yellow.  Do not lift heavy objects.  Do not stand for long periods of time.  Quit smoking. This should  keep you from developing a frequent cough. SEEK MEDICAL CARE IF:   A bulge develops in your groin area.  You feel pain, a burning sensation or pressure in the groin. This might be worse if you are lifting or straining.  You develop a fever of more than 100.5 F (38.1 C). SEEK IMMEDIATE MEDICAL CARE IF:   Pain in the groin increases suddenly.  A bulge in the groin gets bigger suddenly and does not go down.  For men, there is sudden pain in the scrotum. Or, the size of the scrotum increases.  A bulge in the groin area becomes red or purple and is painful to touch.  You have nausea or vomiting that does not go away.  You feel your heart beating much faster than normal.  You cannot have a bowel movement or pass gas.  You develop a fever of more than 102.0 F (38.9 C). Document Released: 07/05/2008 Document Revised: 05/11/2011 Document Reviewed: 07/05/2008 [facility] Patient Information 2015 [place], [place]. This information is not intended to replace advice given to you by your health care provider. Make sure  you discuss any questions you have with your health care provider.

## 2014-10-01 ENCOUNTER — Encounter (HOSPITAL_COMMUNITY): Payer: Self-pay

## 2014-10-01 ENCOUNTER — Encounter (HOSPITAL_COMMUNITY)
Admission: RE | Admit: 2014-10-01 | Discharge: 2014-10-01 | Disposition: A | Discharge status: Home / Self Care | Payer: Medicare Other | Source: Ambulatory Visit | Attending: General Surgery | Admitting: General Surgery

## 2014-10-01 DIAGNOSIS — K4090 Unilateral inguinal hernia, without obstruction or gangrene, not specified as recurrent: Secondary | ICD-10-CM | POA: Insufficient documentation

## 2014-10-01 DIAGNOSIS — Z01818 Encounter for other preprocedural examination: Secondary | ICD-10-CM | POA: Diagnosis present

## 2014-10-01 HISTORY — DX: Other complications of anesthesia, initial encounter: T88.59XA

## 2014-10-01 HISTORY — DX: Adverse effect of unspecified anesthetic, initial encounter: T41.45XA

## 2014-10-01 HISTORY — DX: Gastro-esophageal reflux disease without esophagitis: K21.9

## 2014-10-01 LAB — CBC WITH DIFFERENTIAL/PLATELET
Basophils Absolute: 0 10*3/uL (ref 0.0–0.1)
Basophils Relative: 0 % (ref 0–1)
Eosinophils Absolute: 0.3 10*3/uL (ref 0.0–0.7)
Eosinophils Relative: 4 % (ref 0–5)
HCT: 48 % (ref 39.0–52.0)
Hemoglobin: 16 g/dL (ref 13.0–17.0)
Lymphocytes Relative: 33 % (ref 12–46)
Lymphs Abs: 2.3 10*3/uL (ref 0.7–4.0)
MCH: 30.1 pg (ref 26.0–34.0)
MCHC: 33.3 g/dL (ref 30.0–36.0)
MCV: 90.4 fL (ref 78.0–100.0)
Monocytes Absolute: 0.6 10*3/uL (ref 0.1–1.0)
Monocytes Relative: 9 % (ref 3–12)
Neutro Abs: 3.8 10*3/uL (ref 1.7–7.7)
Neutrophils Relative %: 54 % (ref 43–77)
Platelets: 239 10*3/uL (ref 150–400)
RBC: 5.31 MIL/uL (ref 4.22–5.81)
RDW: 14.5 % (ref 11.5–15.5)
WBC: 7 10*3/uL (ref 4.0–10.5)

## 2014-10-01 LAB — BASIC METABOLIC PANEL
Anion gap: 8 (ref 5–15)
BUN: 18 mg/dL (ref 6–20)
CO2: 28 mmol/L (ref 22–32)
Calcium: 9 mg/dL (ref 8.9–10.3)
Chloride: 104 mmol/L (ref 101–111)
Creatinine, Ser: 1.2 mg/dL (ref 0.61–1.24)
GFR calc Af Amer: >60 mL/min (ref >60)
GFR calc non Af Amer: 57 mL/min — ABNORMAL LOW (ref >60)
Glucose, Bld: 101 mg/dL — ABNORMAL HIGH (ref 65–99)
Potassium: 4.7 mmol/L (ref 3.5–5.1)
Sodium: 140 mmol/L (ref 135–145)

## 2014-10-08 ENCOUNTER — Encounter (HOSPITAL_COMMUNITY): Payer: Self-pay | Admitting: *Deleted

## 2014-10-08 ENCOUNTER — Encounter (HOSPITAL_COMMUNITY)
Admission: RE | Disposition: A | Discharge status: Home / Self Care | Payer: Self-pay | Source: Ambulatory Visit | Attending: General Surgery

## 2014-10-08 ENCOUNTER — Ambulatory Visit (HOSPITAL_COMMUNITY): Payer: Medicare Other | Admitting: Anesthesiology

## 2014-10-08 ENCOUNTER — Ambulatory Visit (HOSPITAL_COMMUNITY)
Admission: RE | Admit: 2014-10-08 | Discharge: 2014-10-08 | Disposition: A | Discharge status: Home / Self Care | Payer: Medicare Other | Source: Ambulatory Visit | Attending: General Surgery | Admitting: General Surgery

## 2014-10-08 DIAGNOSIS — K4090 Unilateral inguinal hernia, without obstruction or gangrene, not specified as recurrent: Secondary | ICD-10-CM | POA: Insufficient documentation

## 2014-10-08 DIAGNOSIS — Z8546 Personal history of malignant neoplasm of prostate: Secondary | ICD-10-CM | POA: Diagnosis not present

## 2014-10-08 DIAGNOSIS — K219 Gastro-esophageal reflux disease without esophagitis: Secondary | ICD-10-CM | POA: Insufficient documentation

## 2014-10-08 DIAGNOSIS — Z7982 Long term (current) use of aspirin: Secondary | ICD-10-CM | POA: Diagnosis not present

## 2014-10-08 DIAGNOSIS — Z79899 Other long term (current) drug therapy: Secondary | ICD-10-CM | POA: Diagnosis not present

## 2014-10-08 HISTORY — PX: INSERTION OF MESH: SHX5868

## 2014-10-08 HISTORY — PX: INGUINAL HERNIA REPAIR: SHX194

## 2014-10-08 SURGERY — REPAIR, HERNIA, INGUINAL, ADULT
Anesthesia: General | Site: Abdomen | Laterality: Left

## 2014-10-08 MED ORDER — FENTANYL CITRATE (PF) 100 MCG/2ML IJ SOLN
25.0000 ug | INTRAMUSCULAR | Status: DC | PRN
  Administered 2014-10-08 (×3): 50 ug via INTRAVENOUS
  Filled 2014-10-08 (×2): qty 2

## 2014-10-08 MED ORDER — SUCCINYLCHOLINE CHLORIDE 20 MG/ML IJ SOLN
INTRAMUSCULAR | Status: AC
  Filled 2014-10-08: qty 1

## 2014-10-08 MED ORDER — MIDAZOLAM HCL 2 MG/2ML IJ SOLN
1.0000 mg | INTRAMUSCULAR | Status: DC | PRN
  Administered 2014-10-08: 2 mg via INTRAVENOUS

## 2014-10-08 MED ORDER — LIDOCAINE HCL (PF) 1 % IJ SOLN
INTRAMUSCULAR | Status: AC
  Filled 2014-10-08: qty 5

## 2014-10-08 MED ORDER — PROPOFOL 10 MG/ML IV BOLUS
INTRAVENOUS | Status: DC | PRN
  Administered 2014-10-08: 100 mg via INTRAVENOUS

## 2014-10-08 MED ORDER — KETOROLAC TROMETHAMINE 30 MG/ML IJ SOLN
30.0000 mg | Freq: Once | INTRAMUSCULAR | Status: AC
Start: 2014-10-08 — End: 2014-10-08
  Administered 2014-10-08: 30 mg via INTRAVENOUS
  Filled 2014-10-08: qty 1

## 2014-10-08 MED ORDER — LACTATED RINGERS IV SOLN
INTRAVENOUS | Status: DC
  Administered 2014-10-08: 1000 mL via INTRAVENOUS

## 2014-10-08 MED ORDER — FENTANYL CITRATE (PF) 250 MCG/5ML IJ SOLN
INTRAMUSCULAR | Status: AC
  Filled 2014-10-08: qty 25

## 2014-10-08 MED ORDER — MIDAZOLAM HCL 2 MG/2ML IJ SOLN
INTRAMUSCULAR | Status: AC
  Filled 2014-10-08: qty 2

## 2014-10-08 MED ORDER — DEXAMETHASONE SODIUM PHOSPHATE 4 MG/ML IJ SOLN
4.0000 mg | Freq: Once | INTRAMUSCULAR | Status: AC
  Administered 2014-10-08: 4 mg via INTRAVENOUS

## 2014-10-08 MED ORDER — BUPIVACAINE LIPOSOME 1.3 % IJ SUSP
INTRAMUSCULAR | Status: AC
  Filled 2014-10-08: qty 20

## 2014-10-08 MED ORDER — LIDOCAINE HCL 1 % IJ SOLN
INTRAMUSCULAR | Status: DC | PRN
  Administered 2014-10-08: 30 mg via INTRADERMAL

## 2014-10-08 MED ORDER — DEXAMETHASONE SODIUM PHOSPHATE 4 MG/ML IJ SOLN
INTRAMUSCULAR | Status: AC
  Filled 2014-10-08: qty 1

## 2014-10-08 MED ORDER — CEFAZOLIN SODIUM-DEXTROSE 2-3 GM-% IV SOLR
2.0000 g | INTRAVENOUS | Status: AC
  Administered 2014-10-08: 2 g via INTRAVENOUS

## 2014-10-08 MED ORDER — SODIUM CHLORIDE 0.9 % IR SOLN
Status: DC | PRN
  Administered 2014-10-08: 1000 mL

## 2014-10-08 MED ORDER — SUCCINYLCHOLINE CHLORIDE 20 MG/ML IJ SOLN
INTRAMUSCULAR | Status: DC | PRN
  Administered 2014-10-08: 100 mg via INTRAVENOUS

## 2014-10-08 MED ORDER — ONDANSETRON HCL 4 MG/2ML IJ SOLN
INTRAMUSCULAR | Status: AC
  Filled 2014-10-08: qty 2

## 2014-10-08 MED ORDER — ONDANSETRON HCL 4 MG/2ML IJ SOLN
4.0000 mg | Freq: Once | INTRAMUSCULAR | Status: AC
  Administered 2014-10-08: 4 mg via INTRAVENOUS

## 2014-10-08 MED ORDER — ROCURONIUM BROMIDE 100 MG/10ML IV SOLN
INTRAVENOUS | Status: DC | PRN
  Administered 2014-10-08: 8 mg via INTRAVENOUS

## 2014-10-08 MED ORDER — PROPOFOL 10 MG/ML IV BOLUS
INTRAVENOUS | Status: AC
  Filled 2014-10-08: qty 20

## 2014-10-08 MED ORDER — CEFAZOLIN SODIUM-DEXTROSE 2-3 GM-% IV SOLR
INTRAVENOUS | Status: AC
  Filled 2014-10-08: qty 50

## 2014-10-08 MED ORDER — ONDANSETRON HCL 4 MG/2ML IJ SOLN: 4.0000 mg | Freq: Once | INTRAMUSCULAR | Status: AC | PRN

## 2014-10-08 MED ORDER — GLYCOPYRROLATE 0.2 MG/ML IJ SOLN
0.2000 mg | Freq: Once | INTRAMUSCULAR | Status: AC
  Administered 2014-10-08: 0.2 mg via INTRAVENOUS

## 2014-10-08 MED ORDER — GLYCOPYRROLATE 0.2 MG/ML IJ SOLN
INTRAMUSCULAR | Status: AC
  Filled 2014-10-08: qty 1

## 2014-10-08 MED ORDER — CHLORHEXIDINE GLUCONATE 4 % EX LIQD: 1.0000 "application " | Freq: Once | CUTANEOUS | Status: DC

## 2014-10-08 MED ORDER — ROCURONIUM BROMIDE 50 MG/5ML IV SOLN
INTRAVENOUS | Status: AC
  Filled 2014-10-08: qty 1

## 2014-10-08 MED ORDER — BUPIVACAINE LIPOSOME 1.3 % IJ SUSP
INTRAMUSCULAR | Status: DC | PRN
  Administered 2014-10-08: 20 mL

## 2014-10-08 MED ORDER — HYDROCODONE-ACETAMINOPHEN 5-325 MG PO TABS: 1.0000 | ORAL_TABLET | Freq: Four times a day (QID) | ORAL | Status: AC | PRN

## 2014-10-08 MED ORDER — FENTANYL CITRATE (PF) 250 MCG/5ML IJ SOLN
INTRAMUSCULAR | Status: DC | PRN
  Administered 2014-10-08: 25 ug via INTRAVENOUS
  Administered 2014-10-08 (×4): 50 ug via INTRAVENOUS
  Administered 2014-10-08: 25 ug via INTRAVENOUS

## 2014-10-08 SURGICAL SUPPLY — 38 items
BAG HAMPER (MISCELLANEOUS) ×2 IMPLANT
CLOTH BEACON ORANGE TIMEOUT ST (SAFETY) ×2 IMPLANT
COVER LIGHT HANDLE STERIS (MISCELLANEOUS) ×4 IMPLANT
DRAIN PENROSE 18X1/2 LTX STRL (DRAIN) ×2 IMPLANT
ELECT REM PT RETURN 9FT ADLT (ELECTROSURGICAL) ×2
ELECTRODE REM PT RTRN 9FT ADLT (ELECTROSURGICAL) ×1 IMPLANT
GLOVE BIOGEL PI IND STRL 7.0 (GLOVE) ×2 IMPLANT
GLOVE BIOGEL PI INDICATOR 7.0 (GLOVE) ×2
GLOVE ECLIPSE 7.0 STRL STRAW (GLOVE) ×2 IMPLANT
GLOVE EXAM NITRILE MD LF STRL (GLOVE) ×2 IMPLANT
GLOVE SURG SS PI 7.5 STRL IVOR (GLOVE) ×2 IMPLANT
GOWN STRL REUS W/ TWL XL LVL3 (GOWN DISPOSABLE) ×1 IMPLANT
GOWN STRL REUS W/TWL LRG LVL3 (GOWN DISPOSABLE) ×4 IMPLANT
GOWN STRL REUS W/TWL XL LVL3 (GOWN DISPOSABLE) ×1
INST SET MINOR GENERAL (KITS) ×2 IMPLANT
KIT ROOM TURNOVER APOR (KITS) ×2 IMPLANT
LIQUID BAND (GAUZE/BANDAGES/DRESSINGS) ×2 IMPLANT
MANIFOLD NEPTUNE II (INSTRUMENTS) ×2 IMPLANT
MESH HERNIA 1.6X1.9 PLUG LRG (Mesh General) ×1 IMPLANT
MESH HERNIA PLUG LRG (Mesh General) ×1 IMPLANT
NEEDLE HYPO 18GX1.5 BLUNT FILL (NEEDLE) ×2 IMPLANT
NEEDLE HYPO 21X1.5 SAFETY (NEEDLE) ×2 IMPLANT
NS IRRIG 1000ML POUR BTL (IV SOLUTION) ×2 IMPLANT
PACK MINOR (CUSTOM PROCEDURE TRAY) ×2 IMPLANT
PAD ARMBOARD 7.5X6 YLW CONV (MISCELLANEOUS) ×2 IMPLANT
SCRUB PCMX 4 OZ (MISCELLANEOUS) ×2 IMPLANT
SET BASIN LINEN APH (SET/KITS/TRAYS/PACK) ×2 IMPLANT
SUT NOVA NAB GS-22 2 2-0 T-19 (SUTURE) ×6 IMPLANT
SUT NOVAFIL NAB HGS22 2-0 30IN (SUTURE) IMPLANT
SUT SILK 3 0 (SUTURE)
SUT SILK 3-0 18XBRD TIE 12 (SUTURE) IMPLANT
SUT VIC AB 2-0 CT1 27 (SUTURE) ×1
SUT VIC AB 2-0 CT1 TAPERPNT 27 (SUTURE) ×1 IMPLANT
SUT VIC AB 3-0 SH 27 (SUTURE) ×1
SUT VIC AB 3-0 SH 27X BRD (SUTURE) ×1 IMPLANT
SUT VIC AB 4-0 PS2 27 (SUTURE) ×2 IMPLANT
SUT VICRYL AB 3 0 TIES (SUTURE) ×2 IMPLANT
SYR 20CC LL (SYRINGE) ×2 IMPLANT

## 2014-10-08 NOTE — Anesthesia Preprocedure Evaluation (Addendum)
Anesthesia Evaluation  Patient identified by MRN, date of birth, ID band Patient awake    Reviewed: Allergy & Precautions, NPO status , Patient's Chart, lab work & pertinent test results  History of Anesthesia Complications (+) history of anesthetic complications (difficult swallowing after GA once)  Airway Mallampati: III  TM Distance: >3 FB     Dental  (+) Poor Dentition, Missing, Partial Upper   Pulmonary neg pulmonary ROS,  breath sounds clear to auscultation        Cardiovascular negative cardio ROS  Rhythm:Regular Rate:Normal     Neuro/Psych    GI/Hepatic GERD-  Medicated and Poorly Controlled,  Endo/Other    Renal/GU      Musculoskeletal   Abdominal   Peds  Hematology   Anesthesia Other Findings   Reproductive/Obstetrics                            Anesthesia Physical Anesthesia Plan  ASA: II  Anesthesia Plan: General   Post-op Pain Management:    Induction: Rapid sequence, Cricoid pressure planned and Intravenous  Airway Management Planned: Oral ETT and Video Laryngoscope Planned  Additional Equipment:   Intra-op Plan:   Post-operative Plan: Extubation in OR  Informed Consent: I have reviewed the patients History and Physical, chart, labs and discussed the procedure including the risks, benefits and alternatives for the proposed anesthesia with the patient or authorized representative who has indicated his/her understanding and acceptance.     Plan Discussed with:   Anesthesia Plan Comments:         Anesthesia Quick Evaluation

## 2014-10-08 NOTE — Anesthesia Postprocedure Evaluation (Signed)
Anesthesia Post Note  Patient: Roger Sutton  Procedure(s) Performed: Procedure(s) (LRB): LEFT INGUINAL HERNIORRHAPHY (Left) INSERTION OF MESH (Left)  Anesthesia type: General  Patient location: PACU  Post pain: Pain level controlled  Post assessment: Post-op Vital signs reviewed, Patient's Cardiovascular Status Stable, Respiratory Function Stable, Patent Airway, No signs of Nausea or vomiting and Pain level controlled   Post vital signs: Reviewed and stable  Level of consciousness: awake and alert   Complications: No apparent anesthesia complications

## 2014-10-08 NOTE — Discharge Instructions (Signed)
Inguinal Hernia, Adult  °Care After °Refer to this sheet in the next few weeks. These discharge instructions provide you with general information on caring for yourself after you leave the hospital. Your caregiver may also give you specific instructions. Your treatment has been planned according to the most current medical practices available, but unavoidable complications sometimes occur. If you have any problems or questions after discharge, please call your caregiver. °HOME CARE INSTRUCTIONS °· Put ice on the operative site. °¨ Put ice in a plastic bag. °¨ Place a towel between your skin and the bag. °¨ Leave the ice on for 15-20 minutes at a time, 03-04 times a day while awake. °· Change bandages (dressings) as directed. °· Keep the wound dry and clean. The wound may be washed gently with soap and water. Gently blot or dab the wound dry. It is okay to take showers 24 to 48 hours after surgery. Do not take baths, use swimming pools, or use hot tubs for 10 days, or as directed by your caregiver. °· Only take over-the-counter or prescription medicines for pain, discomfort, or fever as directed by your caregiver. °· Continue your normal diet as directed. °· Do not lift anything more than 10 pounds or play contact sports for 3 weeks, or as directed. °SEEK MEDICAL CARE IF: °· There is redness, swelling, or increasing pain in the wound. °· There is fluid (pus) coming from the wound. °· There is drainage from a wound lasting longer than 1 day. °· You have an oral temperature above 102° F (38.9° C). °· You notice a bad smell coming from the wound or dressing. °· The wound breaks open after the stitches (sutures) have been removed. °· You notice increasing pain in the shoulders (shoulder strap areas). °· You develop dizzy episodes or fainting while standing. °· You feel sick to your stomach (nauseous) or throw up (vomit). °SEEK IMMEDIATE MEDICAL CARE IF: °· You develop a rash. °· You have difficulty breathing. °· You  develop a reaction or have side effects to medicines you were given. °MAKE SURE YOU:  °· Understand these instructions. °· Will watch your condition. °· Will get help right away if you are not doing well or get worse. °Document Released: 03/19/2006 Document Revised: 05/11/2011 Document Reviewed: 01/16/2009 °[employer]® Patient Information ©2015 [employer]. This information is not intended to replace advice given to you by your health care provider. Make sure you discuss any questions you have with your health care provider. ° °

## 2014-10-08 NOTE — Interval H&P Note (Signed)
History and Physical Interval Note:  10/08/2014 9:35 AM  Roger Sutton  has presented today for surgery, with the diagnosis of left inguinal hernia  The various methods of treatment have been discussed with the patient and family. After consideration of risks, benefits and other options for treatment, the patient has consented to  Procedure(s): LEFT INGUINAL HERNIORRHAPHY (Left) INSERTION OF MESH (Left) as a surgical intervention .  The patient's history has been reviewed, patient examined, no change in status, stable for surgery.  I have reviewed the patient's chart and labs.  Questions were answered to the patient's satisfaction.     Aviva Signs A

## 2014-10-08 NOTE — Transfer of Care (Signed)
Immediate Anesthesia Transfer of Care Note  Patient: Roger Sutton  Procedure(s) Performed: Procedure(s): LEFT INGUINAL HERNIORRHAPHY (Left) INSERTION OF MESH (Left)  Patient Location: PACU  Anesthesia Type:General  Level of Consciousness: awake  Airway & Oxygen Therapy: Patient Spontanous Breathing and Patient connected to face mask oxygen  Post-op Assessment: Report given to RN  Post vital signs: Reviewed and stable  Last Vitals:  Filed Vitals:   10/08/14 0935  BP: 138/80  Temp:   Resp: 26    Complications: No apparent anesthesia complications

## 2014-10-08 NOTE — Op Note (Signed)
Patient:  Roger Sutton  DOB:  1935-03-03  MRN:  [id number]   Preop Diagnosis:  Left inguinal hernia  Postop Diagnosis:  Same  Procedure:  Left inguinal herniorrhaphy with mesh  Surgeon:  Aviva Signs, M.D.  Anes:  Gen. endotracheal  Indications:  Patient is a 79 year old white male who presents with a left inguinal hernia. The risks and benefits of the procedure including bleeding, infection, mesh use, and the possibility of recurrence of the hernia were fully explained to the patient, who gave informed consent.  Procedure note:  The patient is placed the supine position. After induction of general endotracheal anesthesia, the left groin region was prepped and draped using usual the sterile technique with PCMX. Surgical site confirmation was performed.  A left groin incision was made down to the external oblique aponeuroses. The aponeuroses was incised to the external ring. A Penrose drain was placed around the spermatic cord. The vase deferens was noted within the spermatic cord. A small lipoma of the cord was found and a high ligation was performed using a 3-0 Vicryl. An indirect hernia sac was found. This was freed away from the spermatic cord up to the peritoneal reflection and inverted. A large Bard PerFix plug was then inserted and secured to the transversalis fascia using a 2-0 Novafil interrupted suture. An onlay patch was placed along the floor of the inguinal canal and secured superiorly to the conjoined tendon and inferiorly to the shelving edge of Poupart's ligament using 2-0 Novafil interrupted sutures. The internal ring was recreated using a 2-0 Novafil interrupted suture. The external oblique aponeuroses was reapproximated using a 2-0 Vicryl running suture. The subcutaneous layer was reapproximated using 3-0 Vicryl interrupted sutures. The skin was closed using 4-0 Vicryl subcuticular suture. Liquid barium was applied.  All tape and needle counts were correct at the end of  the procedure. The patient was extubated in the operating room and transferred to PACU in stable condition.  Complications:  None  EBL:  Minimal  Specimen:  None

## 2014-10-08 NOTE — Anesthesia Procedure Notes (Signed)
Procedure Name: Intubation Date/Time: 10/08/2014 9:50 AM Performed by: Ollen Bowl Pre-anesthesia Checklist: Patient identified, Patient being monitored, Timeout performed, Emergency Drugs available and Suction available Patient Re-evaluated:Patient Re-evaluated prior to inductionOxygen Delivery Method: Circle System Utilized Preoxygenation: Pre-oxygenation with 100% oxygen Intubation Type: IV induction, Rapid sequence and Cricoid Pressure applied Ventilation: Mask ventilation without difficulty Grade View: Grade I Tube type: Oral Tube size: 7.0 mm Number of attempts: 1 Airway Equipment and Method: Stylet and Video-laryngoscopy Placement Confirmation: ETT inserted through vocal cords under direct vision,  positive ETCO2 and breath sounds checked- equal and bilateral Secured at: 23 cm Tube secured with: Tape Dental Injury: Teeth and Oropharynx as per pre-operative assessment  Comments: Slightly anterior, but more concerned with swallowing difficulties after last general anesthetic, so started with glidescope.  Grade one view with glidescope by Dr. Patsey Berthold.

## 2014-10-09 ENCOUNTER — Encounter (HOSPITAL_COMMUNITY): Payer: Self-pay | Admitting: General Surgery

## 2016-07-13 ENCOUNTER — Emergency Department (HOSPITAL_COMMUNITY): Payer: Medicare Other

## 2016-07-13 ENCOUNTER — Observation Stay (HOSPITAL_COMMUNITY)
Admission: EM | Admit: 2016-07-13 | Discharge: 2016-07-14 | Disposition: A | Discharge status: Home / Self Care | Payer: Medicare Other | Attending: Family Medicine | Admitting: Family Medicine

## 2016-07-13 ENCOUNTER — Observation Stay (HOSPITAL_BASED_OUTPATIENT_CLINIC_OR_DEPARTMENT_OTHER): Payer: Medicare Other

## 2016-07-13 ENCOUNTER — Encounter (HOSPITAL_COMMUNITY): Payer: Self-pay | Admitting: Emergency Medicine

## 2016-07-13 DIAGNOSIS — R079 Chest pain, unspecified: Secondary | ICD-10-CM | POA: Diagnosis not present

## 2016-07-13 DIAGNOSIS — H409 Unspecified glaucoma: Secondary | ICD-10-CM | POA: Diagnosis not present

## 2016-07-13 DIAGNOSIS — Z8673 Personal history of transient ischemic attack (TIA), and cerebral infarction without residual deficits: Secondary | ICD-10-CM | POA: Insufficient documentation

## 2016-07-13 DIAGNOSIS — I249 Acute ischemic heart disease, unspecified: Secondary | ICD-10-CM

## 2016-07-13 DIAGNOSIS — E785 Hyperlipidemia, unspecified: Secondary | ICD-10-CM | POA: Diagnosis present

## 2016-07-13 DIAGNOSIS — Z79899 Other long term (current) drug therapy: Secondary | ICD-10-CM | POA: Diagnosis not present

## 2016-07-13 DIAGNOSIS — K219 Gastro-esophageal reflux disease without esophagitis: Secondary | ICD-10-CM | POA: Diagnosis not present

## 2016-07-13 DIAGNOSIS — I119 Hypertensive heart disease without heart failure: Secondary | ICD-10-CM | POA: Insufficient documentation

## 2016-07-13 DIAGNOSIS — E119 Type 2 diabetes mellitus without complications: Secondary | ICD-10-CM | POA: Insufficient documentation

## 2016-07-13 DIAGNOSIS — Z7982 Long term (current) use of aspirin: Secondary | ICD-10-CM | POA: Insufficient documentation

## 2016-07-13 DIAGNOSIS — R030 Elevated blood-pressure reading, without diagnosis of hypertension: Secondary | ICD-10-CM

## 2016-07-13 DIAGNOSIS — Z7902 Long term (current) use of antithrombotics/antiplatelets: Secondary | ICD-10-CM | POA: Insufficient documentation

## 2016-07-13 DIAGNOSIS — R072 Precordial pain: Secondary | ICD-10-CM

## 2016-07-13 HISTORY — DX: Transient cerebral ischemic attack, unspecified: G45.9

## 2016-07-13 HISTORY — DX: Hyperlipidemia, unspecified: E78.5

## 2016-07-13 LAB — CBC
HCT: 43.6 % (ref 39.0–52.0)
Hemoglobin: 14.7 g/dL (ref 13.0–17.0)
MCH: 30.6 pg (ref 26.0–34.0)
MCHC: 33.7 g/dL (ref 30.0–36.0)
MCV: 90.6 fL (ref 78.0–100.0)
Platelets: 221 10*3/uL (ref 150–400)
RBC: 4.81 MIL/uL (ref 4.22–5.81)
RDW: 14.6 % (ref 11.5–15.5)
WBC: 6.1 10*3/uL (ref 4.0–10.5)

## 2016-07-13 LAB — BASIC METABOLIC PANEL
Anion gap: 8 (ref 5–15)
BUN: 21 mg/dL — ABNORMAL HIGH (ref 6–20)
CO2: 26 mmol/L (ref 22–32)
Calcium: 8.4 mg/dL — ABNORMAL LOW (ref 8.9–10.3)
Chloride: 106 mmol/L (ref 101–111)
Creatinine, Ser: 1.14 mg/dL (ref 0.61–1.24)
GFR calc Af Amer: >60 mL/min (ref >60)
GFR calc non Af Amer: 60 mL/min — ABNORMAL LOW (ref >60)
Glucose, Bld: 102 mg/dL — ABNORMAL HIGH (ref 65–99)
Potassium: 3.7 mmol/L (ref 3.5–5.1)
Sodium: 140 mmol/L (ref 135–145)

## 2016-07-13 LAB — MRSA PCR SCREENING: MRSA by PCR: NEGATIVE

## 2016-07-13 LAB — I-STAT TROPONIN, ED: Troponin i, poc: 0.01 ng/mL (ref 0.00–0.08)

## 2016-07-13 LAB — ECHOCARDIOGRAM COMPLETE
Height: 68 in
Weight: 2779.56 oz

## 2016-07-13 LAB — TROPONIN I
Troponin I: <0.03 ng/mL (ref <0.03)
Troponin I: <0.03 ng/mL (ref <0.03)

## 2016-07-13 LAB — APTT: aPTT: 33 seconds (ref 24–36)

## 2016-07-13 LAB — PROTIME-INR
INR: 1.1
Prothrombin Time: 14.2 seconds (ref 11.4–15.2)

## 2016-07-13 LAB — HEPARIN LEVEL (UNFRACTIONATED)
Heparin Unfractionated: 0.69 IU/mL (ref 0.30–0.70)
Heparin Unfractionated: 0.62 IU/mL (ref 0.30–0.70)

## 2016-07-13 LAB — MAGNESIUM: Magnesium: 1.9 mg/dL (ref 1.7–2.4)

## 2016-07-13 MED ORDER — HEPARIN (PORCINE) IN NACL 100-0.45 UNIT/ML-% IJ SOLN
1050.0000 [IU]/h | INTRAMUSCULAR | Status: DC
  Administered 2016-07-14: 1050 [IU]/h via INTRAVENOUS
  Filled 2016-07-13: qty 250

## 2016-07-13 MED ORDER — HEPARIN BOLUS VIA INFUSION: 4000.0000 [IU] | Freq: Once | INTRAVENOUS | Status: DC

## 2016-07-13 MED ORDER — MAGNESIUM SULFATE 2 GM/50ML IV SOLN
2.0000 g | Freq: Once | INTRAVENOUS | Status: AC
  Administered 2016-07-13: 2 g via INTRAVENOUS
  Filled 2016-07-13: qty 50

## 2016-07-13 MED ORDER — ASPIRIN 325 MG PO TABS
325.0000 mg | ORAL_TABLET | Freq: Once | ORAL | Status: AC
Start: 2016-07-13 — End: 2016-07-13
  Administered 2016-07-13: 325 mg via ORAL
  Filled 2016-07-13: qty 1

## 2016-07-13 MED ORDER — NITROGLYCERIN 2 % TD OINT
1.0000 [in_us] | TOPICAL_OINTMENT | Freq: Once | TRANSDERMAL | Status: AC
  Administered 2016-07-13: 1 [in_us] via TOPICAL
  Filled 2016-07-13: qty 1

## 2016-07-13 MED ORDER — ONDANSETRON HCL 4 MG/2ML IJ SOLN
INTRAMUSCULAR | Status: AC
  Filled 2016-07-13: qty 2

## 2016-07-13 MED ORDER — FAMOTIDINE IN NACL 20-0.9 MG/50ML-% IV SOLN
INTRAVENOUS | Status: AC
  Filled 2016-07-13: qty 50

## 2016-07-13 MED ORDER — HEPARIN (PORCINE) IN NACL 100-0.45 UNIT/ML-% IJ SOLN
14.0000 [IU]/kg/h | Freq: Once | INTRAMUSCULAR | Status: AC
  Administered 2016-07-13: 14 [IU]/kg/h via INTRAVENOUS
  Filled 2016-07-13: qty 250

## 2016-07-13 MED ORDER — NITROGLYCERIN IN D5W 200-5 MCG/ML-% IV SOLN
5.0000 ug/min | Freq: Once | INTRAVENOUS | Status: AC
  Administered 2016-07-13: 5 ug/min via INTRAVENOUS
  Filled 2016-07-13: qty 250

## 2016-07-13 MED ORDER — ALPRAZOLAM 0.25 MG PO TABS: 0.2500 mg | ORAL_TABLET | Freq: Two times a day (BID) | ORAL | Status: DC | PRN

## 2016-07-13 MED ORDER — NITROGLYCERIN IN D5W 200-5 MCG/ML-% IV SOLN
5.0000 ug/min | Freq: Once | INTRAVENOUS | Status: DC
Start: 2016-07-13 — End: 2016-07-13

## 2016-07-13 MED ORDER — PANTOPRAZOLE SODIUM 40 MG PO TBEC
40.0000 mg | DELAYED_RELEASE_TABLET | Freq: Every day | ORAL | Status: DC
  Administered 2016-07-13 – 2016-07-14 (×2): 40 mg via ORAL
  Filled 2016-07-13 (×2): qty 1

## 2016-07-13 MED ORDER — CLOPIDOGREL BISULFATE 75 MG PO TABS
75.0000 mg | ORAL_TABLET | Freq: Every day | ORAL | Status: DC
  Administered 2016-07-13 – 2016-07-14 (×2): 75 mg via ORAL
  Filled 2016-07-13 (×2): qty 1

## 2016-07-13 MED ORDER — FAMOTIDINE IN NACL 20-0.9 MG/50ML-% IV SOLN
20.0000 mg | Freq: Once | INTRAVENOUS | Status: AC
  Administered 2016-07-13: 20 mg via INTRAVENOUS

## 2016-07-13 MED ORDER — ACETAMINOPHEN 500 MG PO TABS
500.0000 mg | ORAL_TABLET | Freq: Four times a day (QID) | ORAL | Status: DC | PRN
  Administered 2016-07-14: 500 mg via ORAL
  Filled 2016-07-13: qty 1

## 2016-07-13 MED ORDER — TIMOLOL MALEATE 0.25 % OP SOLN
1.0000 [drp] | Freq: Two times a day (BID) | OPHTHALMIC | Status: DC
  Administered 2016-07-13 – 2016-07-14 (×3): 1 [drp] via OPHTHALMIC
  Filled 2016-07-13: qty 5

## 2016-07-13 MED ORDER — HEPARIN (PORCINE) IN NACL 100-0.45 UNIT/ML-% IJ SOLN: 950.0000 [IU]/h | INTRAMUSCULAR | Status: DC

## 2016-07-13 MED ORDER — HEPARIN SODIUM (PORCINE) 5000 UNIT/ML IJ SOLN
4000.0000 [IU] | Freq: Once | INTRAMUSCULAR | Status: AC
  Administered 2016-07-13: 4000 [IU] via INTRAVENOUS

## 2016-07-13 MED ORDER — FENTANYL CITRATE (PF) 100 MCG/2ML IJ SOLN
25.0000 ug | Freq: Once | INTRAMUSCULAR | Status: AC
  Administered 2016-07-13: 25 ug via INTRAVENOUS

## 2016-07-13 MED ORDER — ONDANSETRON HCL 4 MG/2ML IJ SOLN: 4.0000 mg | Freq: Four times a day (QID) | INTRAMUSCULAR | Status: DC | PRN

## 2016-07-13 MED ORDER — FENTANYL CITRATE (PF) 100 MCG/2ML IJ SOLN
INTRAMUSCULAR | Status: AC
  Filled 2016-07-13: qty 2

## 2016-07-13 MED ORDER — ONDANSETRON HCL 4 MG/2ML IJ SOLN
4.0000 mg | Freq: Once | INTRAMUSCULAR | Status: AC
  Administered 2016-07-13: 4 mg via INTRAVENOUS

## 2016-07-13 NOTE — ED Triage Notes (Signed)
Pt c/o chest pain that started at 0230 with sob and excessive sweating.

## 2016-07-13 NOTE — Progress Notes (Signed)
Patient continues to deny any chest pain at this time. Reports that he has some muscular pain with movement across chest. Excellent appetite noted. OOB up to toilet several times this shift w/ stand-by assist w/o difficulty. Had BM today.

## 2016-07-13 NOTE — Progress Notes (Signed)
[place] for heparin Indication: chest pain/ACS  No Known Allergies  Patient Measurements: Height: 5\' 8"  (172.7 cm) Weight: 173 lb 11.6 oz (78.8 kg) IBW/kg (Calculated) : 68.4 HEPARIN DW (KG): 78.8   Vital Signs: Temp: 98.4 F (36.9 C) (05/14 0900) Temp Source: Oral (05/14 0900) BP: 134/69 (05/14 1500) Pulse Rate: 60 (05/14 1500)  Labs:  Recent Labs  07/13/16 0428 07/13/16 0519 07/13/16 0935 07/13/16 1427  HGB  --  14.7  --   --   HCT  --  43.6  --   --   PLT  --  221  --   --   APTT  --  33  --   --   LABPROT  --  14.2  --   --   INR  --  1.10  --   --   HEPARINUNFRC  --   --   --  0.69  CREATININE 1.14  --   --   --   TROPONINI  --   --  <0.03  --    Estimated Creatinine Clearance: 51.7 mL/min (by C-G formula based on SCr of 1.14 mg/dL).  Medical History: Past Medical History:  Diagnosis Date  . Cancer [facility])    prostate  . Complication of anesthesia    Pt had a difficult time swallowing after surgery.  lost 20 lbs after hernia surgery  . GERD (gastroesophageal reflux disease)   . Glaucoma   . Hyperlipidemia   . Osteoarthritis of glenohumeral joint 12/15/2012  . TIA (transient ischemic attack)     Medications:  Scheduled:  . clopidogrel  75 mg Oral Daily  . pantoprazole  40 mg Oral Daily  . timolol  1 drop Both Eyes BID   Infusions:  . heparin 1,150 Units/hr (07/13/16 0924)   PRN:  Anti-infectives    None      Assessment: 81 yo to ED c/o chest pain, SOB, and diaphoresis.  Heparin drip initiated and initial level is 0.69 units/ml  Goal of Therapy:  Heparin level 0.3-0.7 units/ml   Plan:  Will decrease heparin drip slightly and recheck level later today to assure therapeutic.  Beverlee Nims, [facility] 07/13/2016,3:41 PM

## 2016-07-13 NOTE — H&P (Signed)
History and Physical    THOMSON HERBERS VQM:[id number] DOB: 02/01/1936 DOA: 07/13/2016  PCP: Andres Shad, MD   Patient coming from: Home.  I have personally briefly reviewed patient's old medical records in [place]  Chief Complaint: Chest pain.  HPI: Roger Sutton is a 81 y.o. male with medical history significant of prostate cancer, GERD, glaucoma, hyperlipidemia, osteoarthritis of the right shoulder, TIA who is coming to the emergency department after waking up around 0230-0300 with pressure-like precordial chest pain, no radiated, associated with dyspnea, diaphoresis and lightheadedness. He denies palpitations, nausea, emesis, recent pitting edema of the lower extremities, PND or orthopnea. He also complains of mild epigastric discomfort, but denies diarrhea, constipation, melena or hematochezia. He denies dysuria, frequency or hematuria.  ED Course: The patient received nitroglycerin and heparin infusion in the ED. Serial EKGs LAD and IVCD, but did not show any dynamic changes. Troponin level was negative. CBC and CMP were unremarkable. Chest radiograph did not show any acute cardiopulmonary pathology.  Review of Systems: As per HPI otherwise 10 point review of systems negative.    Past Medical History:  Diagnosis Date  . Cancer [facility])    prostate  . Complication of anesthesia    Pt had a difficult time swallowing after surgery.  lost 20 lbs after hernia surgery  . GERD (gastroesophageal reflux disease)   . Glaucoma   . Hyperlipidemia   . Osteoarthritis of glenohumeral joint 12/15/2012  . TIA (transient ischemic attack)     Past Surgical History:  Procedure Laterality Date  . HERNIA REPAIR Right 2015  . INGUINAL HERNIA REPAIR Left 10/08/2014   Procedure: LEFT INGUINAL HERNIORRHAPHY;  Surgeon: Aviva Signs, MD;  Location: [facility];  Service: General;  Laterality: Left;  . INSERTION OF MESH Left 10/08/2014   Procedure: INSERTION OF MESH;  Surgeon: Aviva Signs, MD;  Location: [facility];  Service: General;  Laterality: Left;  . KNEE SURGERY    . PROSTATECTOMY       reports that he has never smoked. He has never used smokeless tobacco. He reports that he does not drink alcohol or use drugs.  No Known Allergies  Family History  Problem Relation Age of Onset  . Heart attack Father   . Diabetes Sister   . Breast cancer Sister     Prior to Admission medications   Medication Sig Start Date End Date Taking? Authorizing Provider  acetaminophen (TYLENOL) 500 MG tablet Take 500 mg by mouth every 6 (six) hours as needed for pain.   Yes [provider]  clopidogrel (PLAVIX) 75 MG tablet Take 75 mg by mouth daily.   Yes [provider]  omeprazole (PRILOSEC) 40 MG capsule Take 1 capsule (40 mg total) by mouth daily. For gastric acid reflux. 12/15/12  Yes Rexene Alberts, MD  timolol (BETIMOL) 0.25 % ophthalmic solution 1-2 drops 2 (two) times daily.   Yes [provider]  aspirin EC 81 MG tablet Take 81 mg by mouth daily.    [provider]  omeprazole (PRILOSEC) 40 MG capsule Take 1 po BID x 2 weeks then once a day Patient taking differently: Take 40 mg by mouth daily.  02/02/13   Rolland Porter, MD  traMADol (ULTRAM) 50 MG tablet Take 50 mg by mouth every 4 (four) hours as needed. For pain 12/12/12   [provider]    Physical Exam: Vitals:   07/13/16 0530 07/13/16 0600 07/13/16 0615 07/13/16 0630  BP: (!) 144/90 127/77  Marland Kitchen)  149/78  Pulse: 64 63 71 70  Resp: (!) 22 14 18 20   Temp:      SpO2: 95% 92% 95% 96%  Weight:      Height:       Constitutional: NAD, calm, comfortable Eyes: PERRL, lids and conjunctivae normal ENMT: Mucous membranes are moist. Posterior pharynx clear of any exudate or lesions. Neck: normal, supple, no masses, no thyromegaly Respiratory: clear to auscultation bilaterally, no wheezing, no crackles. Normal respiratory effort. No accessory muscle use.  Cardiovascular: Regular  rate and rhythm, no murmurs / rubs / gallops. No extremity edema. 2+ pedal pulses. No carotid bruits.  Abdomen: no tenderness, no masses palpated. No hepatosplenomegaly. Bowel sounds positive.  Musculoskeletal: no clubbing / cyanosis. Good ROM, no contractures. Normal muscle tone.  Skin: Hyperpigmented and erythematosus macules and plaques on upper back. Neurologic: CN 2-12 grossly intact. Sensation intact, DTR normal. Strength 5/5 in all 4.  Psychiatric: Normal judgment and insight. Alert and oriented x 3. Normal mood.  Labs on Admission: I have personally reviewed following labs and imaging studies  CBC:  Recent Labs Lab 07/13/16 0519  WBC 6.1  HGB 14.7  HCT 43.6  MCV 90.6  PLT 270   Basic Metabolic Panel:  Recent Labs Lab 07/13/16 0428 07/13/16 0519  NA 140  --   K 3.7  --   CL 106  --   CO2 26  --   GLUCOSE 102*  --   BUN 21*  --   CREATININE 1.14  --   CALCIUM 8.4*  --   MG  --  1.9   GFR: Estimated Creatinine Clearance: 51.7 mL/min (by C-G formula based on SCr of 1.14 mg/dL). Liver Function Tests: No results for input(s): AST, ALT, ALKPHOS, BILITOT, PROT, ALBUMIN in the last 168 hours. No results for input(s): LIPASE, AMYLASE in the last 168 hours. No results for input(s): AMMONIA in the last 168 hours. Coagulation Profile:  Recent Labs Lab 07/13/16 0519  INR 1.10   Cardiac Enzymes: No results for input(s): CKTOTAL, CKMB, CKMBINDEX, TROPONINI in the last 168 hours. BNP (last 3 results) No results for input(s): PROBNP in the last 8760 hours. HbA1C: No results for input(s): HGBA1C in the last 72 hours. CBG: No results for input(s): GLUCAP in the last 168 hours. Lipid Profile: No results for input(s): CHOL, HDL, LDLCALC, TRIG, CHOLHDL, LDLDIRECT in the last 72 hours. Thyroid Function Tests: No results for input(s): TSH, T4TOTAL, FREET4, T3FREE, THYROIDAB in the last 72 hours. Anemia Panel: No results for input(s): VITAMINB12, FOLATE, FERRITIN, TIBC,  IRON, RETICCTPCT in the last 72 hours. Urine analysis:    Component Value Date/Time   COLORURINE YELLOW 02/02/2013 0056   APPEARANCEUR CLEAR 02/02/2013 0056   LABSPEC 1.025 02/02/2013 0056   PHURINE 6.0 02/02/2013 0056   GLUCOSEU NEGATIVE 02/02/2013 0056   HGBUR TRACE (A) 02/02/2013 0056   BILIRUBINUR NEGATIVE 02/02/2013 0056   KETONESUR NEGATIVE 02/02/2013 0056   PROTEINUR NEGATIVE 02/02/2013 0056   UROBILINOGEN 0.2 02/02/2013 0056   NITRITE NEGATIVE 02/02/2013 0056   LEUKOCYTESUR NEGATIVE 02/02/2013 0056    Radiological Exams on Admission: Dg Chest Portable 1 View  Result Date: 07/13/2016 CLINICAL DATA:  Chest pain, onset today. EXAM: PORTABLE CHEST 1 VIEW COMPARISON:  Radiograph 02/02/2013.  CT 12/14/2012 FINDINGS: Lower lung volumes from prior exam. There is peribronchial thickening. Unchanged elevation of right hemidiaphragm allowing differences in technique. Bibasilar atelectasis. The heart is normal in size. Mediastinal contours are unchanged allowing for rotation. No pulmonary edema.  No large pleural effusion. No pneumothorax. No evidence of acute osseous abnormalities. IMPRESSION: Peribronchial thickening, may be infectious or inflammatory. Bibasilar atelectasis. Electronically Signed   By: Jeb Levering M.D.   On: 07/13/2016 05:06    EKG: Independently reviewed. Vent. rate 67 BPM PR interval * ms QRS duration 119 ms QT/QTc 418/442 ms P-R-T axes 24 -43 63 Sinus rhythm Nonspecific IVCD with LAD Left ventricular hypertrophy ST elevation, consider inferior injury  Assessment/Plan Principal Problem:   Chest pain Admit to stepdown/observation. Continue heparin infusion. Continue nitroglycerin infusion. Continue Plavix 75 mg by mouth daily. Trend troponin levels. Check echocardiogram. Cardiology evaluation later today.  Active Problems:   GERD (gastroesophageal reflux disease) Protonix 40 mg by mouth daily.    Glaucoma Continue timolol drops.     Hyperlipidemia Currently not taking atorvastatin, which was prescribed for him in the past. Check fasting lipid profile.   DVT prophylaxis: On heparin infusion. Code Status: Full code. Family Communication: His wife was present in the ED. Disposition Plan: Admitted for troponin level trending, nitroglycerin and heparin infusion, cardiology evaluation later today. Consults called: Cardiology. Admission status: Observation/telemetry.   Reubin Milan MD [facility] Pager [phone].  If 7PM-7AM, please contact night-coverage [email or web address] Password [facility]  07/13/2016, 6:44 AM

## 2016-07-13 NOTE — Progress Notes (Addendum)
ANTICOAGULATION CONSULT NOTE - Preliminary  Pharmacy Consult for heparin Indication: chest pain/ACS  No Known Allergies  Patient Measurements: Height: 5\' 8"  (172.7 cm) Weight: 177 lb (80.3 kg) IBW/kg (Calculated) : 68.4 HEPARIN DW (KG): 80.3   Vital Signs: Temp: 97.7 F (36.5 C) (05/14 0416) BP: 144/90 (05/14 0530) Pulse Rate: 64 (05/14 0530)  Labs:  Recent Labs  07/13/16 0428 07/13/16 0519  HGB  --  14.7  HCT  --  43.6  PLT  --  221  CREATININE 1.14  --    Estimated Creatinine Clearance: 51.7 mL/min (by C-G formula based on SCr of 1.14 mg/dL).  Medical History: Past Medical History:  Diagnosis Date  . Cancer [facility])    prostate  . Complication of anesthesia    Pt had a difficult time swallowing after surgery.  lost 20 lbs after hernia surgery  . GERD (gastroesophageal reflux disease)   . Glaucoma   . Osteoarthritis of glenohumeral joint 12/15/2012    Medications:  Scheduled:  . heparin  4,000 Units Intravenous Once   Infusions:  . heparin     PRN:  Anti-infectives    None      Assessment: 81 yo to ED c/o chest pain, SOB, and diaphoresis.  No hx  CAD. No ST elevation on EKG. Starting NGT gtt and Heparin gtt.   Goal of Therapy:  Heparin level 0.3-0.7 units/ml   Plan:  Give 4000 units bolus x 1 Start heparin infusion at 950 units/hr Check anti-Xa level in 6 hours and daily while on heparin Continue to monitor H&H and platelets Preliminary review of pertinent patient information completed.  Forestine Na clinical pharmacist will complete review during morning rounds to assess the patient and finalize treatment regimen.  Nyra Capes, [facility] 07/13/2016,6:01 AM   Addum:  Will increase heparin drip to 1150 units/hr.  Check heparin level in 6 hours.  Daily HL and CBC while on heparin. Excell Seltzer, PharmD

## 2016-07-13 NOTE — ED Provider Notes (Addendum)
[facility] DEPT Provider Note   CSN: [id number] Arrival date & time: 07/13/16  0407     History   Chief Complaint Chief Complaint  Patient presents with  . Chest Pain    HPI SUMNER KIRCHMAN is a 81 y.o. male.  HPI  This is a 81 year old male with a history of prostate cancer who presents for chest pain. Patient reports that he woke up at 2:30 AM with sternal pressure-like chest pain. He reports diaphoresis and shortness of breath. He states initially was 7 out of 10. It is currently 5 out of 10.  He took Tylenol prior to arrival. No history of coronary artery disease. Past history of hypertension, hyperlipidemia, diabetes.  Denies any lower extremity swelling or recent travel. Does report belching after the onset of pain. No abdominal pain, nausea, vomiting.  Past Medical History:  Diagnosis Date  . Cancer [facility])    prostate  . Complication of anesthesia    Pt had a difficult time swallowing after surgery.  lost 20 lbs after hernia surgery  . GERD (gastroesophageal reflux disease)   . Glaucoma   . Osteoarthritis of glenohumeral joint 12/15/2012    Patient Active Problem List   Diagnosis Date Noted  . Osteoarthritis of glenohumeral joint 12/15/2012  . Chest pain 12/14/2012  . Inguinal hernia 12/14/2012  . GERD (gastroesophageal reflux disease) 12/14/2012    Past Surgical History:  Procedure Laterality Date  . HERNIA REPAIR    . INGUINAL HERNIA REPAIR Left 10/08/2014   Procedure: LEFT INGUINAL HERNIORRHAPHY;  Surgeon: Aviva Signs, MD;  Location: [facility];  Service: General;  Laterality: Left;  . INSERTION OF MESH Left 10/08/2014   Procedure: INSERTION OF MESH;  Surgeon: Aviva Signs, MD;  Location: [facility];  Service: General;  Laterality: Left;  . KNEE SURGERY    . PROSTATECTOMY         Home Medications    Prior to Admission medications   Medication Sig Start Date End Date Taking? Authorizing Provider  acetaminophen (TYLENOL) 500 MG tablet Take 500 mg by  mouth every 6 (six) hours as needed for pain.   Yes [provider]  omeprazole (PRILOSEC) 40 MG capsule Take 1 capsule (40 mg total) by mouth daily. For gastric acid reflux. 12/15/12  Yes Rexene Alberts, MD  timolol (BETIMOL) 0.25 % ophthalmic solution 1-2 drops 2 (two) times daily.   Yes [provider]  aspirin EC 81 MG tablet Take 81 mg by mouth daily.    [provider]  omeprazole (PRILOSEC) 40 MG capsule Take 1 po BID x 2 weeks then once a day Patient taking differently: Take 40 mg by mouth daily.  02/02/13   Rolland Porter, MD  traMADol (ULTRAM) 50 MG tablet Take 50 mg by mouth every 4 (four) hours as needed. For pain 12/12/12   [provider]    Family History History reviewed. No pertinent family history.  Social History Social History  Substance Use Topics  . Smoking status: Never Smoker  . Smokeless tobacco: Never Used  . Alcohol use No     Allergies   Patient has no known allergies.   Review of Systems Review of Systems  Constitutional: Negative for fever.  Respiratory: Positive for shortness of breath.   Cardiovascular: Positive for chest pain. Negative for leg swelling.  Gastrointestinal: Negative for abdominal pain, nausea and vomiting.  All other systems reviewed and are negative.    Physical Exam Updated Vital Signs BP (!) 144/90   Pulse  64   Temp 97.7 F (36.5 C)   Resp (!) 22   Ht 5\' 8"  (1.727 m)   Wt 177 lb (80.3 kg)   SpO2 95%   BMI 26.91 kg/m   Physical Exam  Constitutional: He is oriented to person, place, and time. He appears well-developed and well-nourished. No distress.  HENT:  Head: Normocephalic and atraumatic.  Cardiovascular: Normal rate, regular rhythm and normal heart sounds.   No murmur heard. Pulmonary/Chest: Effort normal and breath sounds normal. No respiratory distress. He has no wheezes.  Abdominal: Soft. Bowel sounds are normal. There is no tenderness. There is no rebound.    Musculoskeletal: He exhibits no edema.  Neurological: He is alert and oriented to person, place, and time.  Skin: Skin is warm and dry.  Psychiatric: He has a normal mood and affect.  Nursing note and vitals reviewed.    ED Treatments / Results  Labs (all labs ordered are listed, but only abnormal results are displayed) Labs Reviewed  BASIC METABOLIC PANEL - Abnormal; Notable for the following:       Result Value   Glucose, Bld 102 (*)    BUN 21 (*)    Calcium 8.4 (*)    GFR calc non Af Amer 60 (*)    All other components within normal limits  CBC  I-STAT TROPOININ, ED    EKG  EKG Interpretation  Date/Time:  Monday Jul 13 2016 04:52:52 EDT Ventricular Rate:  68 PR Interval:    QRS Duration: 116 QT Interval:  411 QTC Calculation: 438 R Axis:   -39 Text Interpretation:  Sinus rhythm Nonspecific IVCD with LAD No STEMI criteria No dynamic changes from prior Confirmed by Thayer Jew [phone]) on 07/13/2016 5:11:53 AM       EKG Interpretation  Date/Time:  Monday Jul 13 2016 05:33:18 EDT Ventricular Rate:  67 PR Interval:    QRS Duration: 119 QT Interval:  418 QTC Calculation: 442 R Axis:   -43 Text Interpretation:  Sinus rhythm Nonspecific IVCD with LAD Left ventricular hypertrophy ST elevation, consider inferior injury No dynamic changes Confirmed by Thayer Jew [phone]) on 07/13/2016 5:35:59 AM        Radiology Dg Chest Portable 1 View  Result Date: 07/13/2016 CLINICAL DATA:  Chest pain, onset today. EXAM: PORTABLE CHEST 1 VIEW COMPARISON:  Radiograph 02/02/2013.  CT 12/14/2012 FINDINGS: Lower lung volumes from prior exam. There is peribronchial thickening. Unchanged elevation of right hemidiaphragm allowing differences in technique. Bibasilar atelectasis. The heart is normal in size. Mediastinal contours are unchanged allowing for rotation. No pulmonary edema. No large pleural effusion. No pneumothorax. No evidence of acute osseous abnormalities. IMPRESSION:  Peribronchial thickening, may be infectious or inflammatory. Bibasilar atelectasis. Electronically Signed   By: Jeb Levering M.D.   On: 07/13/2016 05:06    Procedures Procedures (including critical care time)  CRITICAL CARE Performed by: Merryl Hacker   Total critical care time: 30 minutes  Critical care time was exclusive of separately billable procedures and treating other patients.  Critical care was necessary to treat or prevent imminent or life-threatening deterioration.  Critical care was time spent personally by me on the following activities: development of treatment plan with patient and/or surrogate as well as nursing, discussions with consultants, evaluation of patient's response to treatment, examination of patient, obtaining history from patient or surrogate, ordering and performing treatments and interventions, ordering and review of laboratory studies, ordering and review of radiographic studies, pulse oximetry and re-evaluation of patient's condition.  Medications Ordered in ED Medications  nitroGLYCERIN 50 mg in dextrose 5 % 250 mL (0.2 mg/mL) infusion (not administered)  heparin ADULT infusion 100 units/mL (25000 units/277mL sodium chloride 0.45%) (not administered)  aspirin tablet 325 mg (325 mg Oral Given 07/13/16 0457)  nitroGLYCERIN (NITROGLYN) 2 % ointment 1 inch (1 inch Topical Given 07/13/16 0459)     Initial Impression / Assessment and Plan / ED Course  I have reviewed the triage vital signs and the nursing notes.  Pertinent labs & imaging results that were available during my care of the patient were reviewed by me and considered in my medical decision making (see chart for details).     Patient presents with chest pain. He is nontoxic-appearing. Vital signs notable for hypertension. No known history of coronary artery disease. However, history is somewhat concerning giving nature of pain associated shortness of breath and diaphoresis. Initial EKG  without evidence of ST elevation. Repeat EKGs without dynamic changes. Patient did have complete resolution of his chest pain with nitroglycerin ointment. Subsequently, nitroglycerin IV and heparin IV were ordered for concern for ACS. Chest x-ray is reassuring. Will discuss with the admitting hospitalist regarding admission to any painful cardiology evaluation versus transfer to [facility] can for cardiology evaluation.  Final Clinical Impressions(s) / ED Diagnoses   Final diagnoses:  Precordial pain  ACS (acute coronary syndrome) [facility])    New Prescriptions New Prescriptions   No medications on file     Merryl Hacker, MD 07/13/16 0540    Merryl Hacker, MD 07/13/16 [phone]

## 2016-07-13 NOTE — Consult Note (Signed)
Cardiology Consultation:   Patient ID: Roger Sutton; [id number]; 1935/06/24   Admit date: 07/13/2016 Date of Consult: 07/13/2016  Primary Care Provider: Andres Shad, MD Primary Cardiologist: None Primary Electrophysiologist:  None   Patient Profile:   Roger Sutton is a 81 y.o. male with a hx of TIA who is being seen today for the evaluation of chest pain at the request of Dr. Sarajane Jews.   History of Present Illness:   Roger Sutton  is a 81 y.o. male with a hx of TIA who is being seen today for the evaluation of chest pain at the request of Dr. Sarajane Jews.  He awoke this morning at 2 am with what he describes as a "pressure" in his chest, rated 5/10. Did not radiate to jaw, back, or arms. It was constant.  Denies palpitations, orthopnea, leg swelling, and PND.  Has felt slightly more fatigued than usual in the past 2 weeks. Denies fevers and cough.  Denies a h/o heart disease.  He presented to the ED and was started on IV heparin and nitroglycerin, currently at 10 mcg/min.  Now rates pain at 1/10.  Troponins normal thus far.  ECG which I personally interpreted shows sinus rhythm with nonspecific IVD and LAFB with a mild nonspecific ST abnormality.  Past Medical History:  Diagnosis Date  . Cancer [facility])    prostate  . Complication of anesthesia    Pt had a difficult time swallowing after surgery.  lost 20 lbs after hernia surgery  . GERD (gastroesophageal reflux disease)   . Glaucoma   . Hyperlipidemia   . Osteoarthritis of glenohumeral joint 12/15/2012  . TIA (transient ischemic attack)     Past Surgical History:  Procedure Laterality Date  . HERNIA REPAIR Right 2015  . INGUINAL HERNIA REPAIR Left 10/08/2014   Procedure: LEFT INGUINAL HERNIORRHAPHY;  Surgeon: Aviva Signs, MD;  Location: [facility];  Service: General;  Laterality: Left;  . INSERTION OF MESH Left 10/08/2014   Procedure: INSERTION OF MESH;  Surgeon: Aviva Signs, MD;  Location:  [facility];  Service: General;  Laterality: Left;  . KNEE SURGERY    . PROSTATECTOMY       Inpatient Medications: Scheduled Meds: . clopidogrel  75 mg Oral Daily  . pantoprazole  40 mg Oral Daily  . timolol  1 drop Both Eyes BID   Continuous Infusions: . heparin 1,150 Units/hr (07/13/16 0924)   PRN Meds: acetaminophen, ALPRAZolam, ondansetron (ZOFRAN) IV  Allergies:   No Known Allergies  Social History:   Social History   Social History  . Marital status: Married    Spouse name: N/A  . Number of children: N/A  . Years of education: N/A   Occupational History  . Not on file.   Social History Main Topics  . Smoking status: Never Smoker  . Smokeless tobacco: Never Used  . Alcohol use No  . Drug use: No  . Sexual activity: Not on file   Other Topics Concern  . Not on file   Social History Narrative  . No narrative on file    Family History:   The patient's family history includes Breast cancer in his sister; Diabetes in his sister; Heart attack in his father.  ROS:  Please see the history of present illness.  All other ROS reviewed and negative.     Physical Exam/Data:   Vitals:   07/13/16 0900 07/13/16 1100 07/13/16 1200 07/13/16 1300  BP: 128/73 117/67 135/79 115/68  Pulse:  60 (!) 59 65 (!) 56  Resp: 15 18 (!) 22 18  Temp: 98.4 F (36.9 C)     TempSrc: Oral     SpO2: 97% 96% 100% 95%  Weight: 173 lb 11.6 oz (78.8 kg)     Height: 5\' 8"  (1.727 m)       Intake/Output Summary (Last 24 hours) at 07/13/16 1441 Last data filed at 07/13/16 1348  Gross per 24 hour  Intake              970 ml  Output              200 ml  Net              770 ml   Filed Weights   07/13/16 0414 07/13/16 0900  Weight: 177 lb (80.3 kg) 173 lb 11.6 oz (78.8 kg)   Body mass index is 26.41 kg/m.  General:  Well nourished, well developed, in no acute distress HEENT: normal Lymph: no adenopathy Neck: no JVD Endocrine:  No thryomegaly Vascular: No carotid bruits; FA pulses 2+  bilaterally without bruits  Cardiac:  normal S1, S2; RRR; soft apical holosystolic murmur  Lungs:  clear to auscultation bilaterally, no wheezing, rhonchi or rales  Abd: soft, nontender, no hepatomegaly  Ext: no edema Musculoskeletal:  No deformities, BUE and BLE strength normal and equal Skin: warm and dry  Neuro:  CNs 2-12 intact, no focal abnormalities noted Psych:  Normal affect    EKG:  The EKG was personally reviewed and demonstrates sinus rhythm with nonspecific IVD and LAFB with a mild nonspecific ST abnormality.   Relevant CV Studies:  Echocardiogram (07/13/16)  - Left ventricle: The cavity size was normal. Wall thickness was   increased in a pattern of mild LVH. Systolic function was normal.   The estimated ejection fraction was in the range of 55% to 60%.   Wall motion was normal; there were no regional wall motion   abnormalities. There was an increased relative contribution of   atrial contraction to ventricular filling. - Aortic valve: There was trivial regurgitation. - Mitral valve: There was moderate regurgitation.  Laboratory Data:  Chemistry Recent Labs Lab 07/13/16 0428  NA 140  K 3.7  CL 106  CO2 26  GLUCOSE 102*  BUN 21*  CREATININE 1.14  CALCIUM 8.4*  GFRNONAA 60*  GFRAA >60  ANIONGAP 8    No results for input(s): PROT, ALBUMIN, AST, ALT, ALKPHOS, BILITOT in the last 168 hours. Hematology Recent Labs Lab 07/13/16 0519  WBC 6.1  RBC 4.81  HGB 14.7  HCT 43.6  MCV 90.6  MCH 30.6  MCHC 33.7  RDW 14.6  PLT 221   Cardiac Enzymes Recent Labs Lab 07/13/16 0935  TROPONINI <0.03    Recent Labs Lab 07/13/16 0443  TROPIPOC 0.01    BNPNo results for input(s): BNP, PROBNP in the last 168 hours.  DDimer No results for input(s): DDIMER in the last 168 hours.  Radiology/Studies:  Dg Chest Portable 1 View  Result Date: 07/13/2016 CLINICAL DATA:  Chest pain, onset today. EXAM: PORTABLE CHEST 1 VIEW COMPARISON:  Radiograph 02/02/2013.  CT  12/14/2012 FINDINGS: Lower lung volumes from prior exam. There is peribronchial thickening. Unchanged elevation of right hemidiaphragm allowing differences in technique. Bibasilar atelectasis. The heart is normal in size. Mediastinal contours are unchanged allowing for rotation. No pulmonary edema. No large pleural effusion. No pneumothorax. No evidence of acute osseous abnormalities. IMPRESSION: Peribronchial thickening, may be infectious or  inflammatory. Bibasilar atelectasis. Electronically Signed   By: Jeb Levering M.D.   On: 07/13/2016 05:06    Assessment and Plan:   1. Chest pain: Unclear etiology. Would recommend continued cycling of troponins. LV systolic function and regional wall motion are normal. Would recommend weaning him off IV nitroglycerin altogether. Reasonable to continue IV heparin until he has ruled out for ACS and he is asymptomatic. If so, can obtain nuclear stress test on 07/14/16. If symptoms persist or increase in intensity, would consider coronary angiography.  2. TIA: On ASA and Plavix. Does not require both.  3. Hypertension: Noted to be intermittently hypertensive in hospital. Needs further monitoring.   Signed, Kate Sable, MD  07/13/2016 2:41 PM

## 2016-07-13 NOTE — Progress Notes (Signed)
  PROGRESS NOTE  Roger Sutton UMP:[id number] DOB: [birth year] DOA: 07/13/2016 PCP: Andres Shad, MD  Brief Narrative: 81 year old man presented with chest pain, was treated with nitroglycerin and heparin in the emergency department, currently pain-free.  Assessment/Plan #1: Chest pain, rule out ACS. Currently asymptomatic -Continue nitroglycerin, heparin infusion pending cardiology evaluation  -Continue aspirin, Plavix  DVT prophylaxis: heparin Code Status: full Family Communication:  Disposition Plan: home    Murray Hodgkins, MD  [facility] Direct contact: [phone] --[place]ia [place]  --[email or web address]; password [email or web address]  7PM-7AM contact night coverage as above 07/13/2016, 8:08 AM  LOS: 0 days   Consultants:  Cardiology   Procedures:    Antimicrobials:    Interval history/Subjective: No chest pain or shortness of breath.  Objective: Vitals: Afebrile, temperature 97.7, pulse 61, respirations 17, SPO2 97% on room air, blood pressure 120/70  Exam:     Constitutional: Appears calm, comfortable, lying in bed   I have personally reviewed the following:   Labs:  Basic metabolic panel unremarkable  Magnesium within normal limits  Initial troponin negative  CBC unremarkable  Imaging studies:  Chest x-ray and apparently reviewed, no acute disease  Medical tests:  EKGs independently reviewed, 0416: Sinus rhythm, left axis deviation. No acute changes. 0932: Sinus rhythm, no acute changes. 0533: Sinus rhythm. No acute changes.  Test discussed with performing physician:    Decision to obtain old records:    Review and summation of old records:    Scheduled Meds: Continuous Infusions: . heparin 950 Units/hr (07/13/16 0615)  . magnesium sulfate 1 - 4 g bolus IVPB 2 g (07/13/16 0747)    Principal Problem:   Chest pain Active Problems:   GERD (gastroesophageal reflux disease)   Glaucoma   Hyperlipidemia   LOS: 0 days

## 2016-07-13 NOTE — ED Notes (Signed)
Dr Sarajane Jews at bedside. Pt denies pain. May have breakfast tray per hospitalist

## 2016-07-13 NOTE — Progress Notes (Signed)
1356 Patient reports no chest pain at this time and stated "It feels more like a muscle strain in my chest." Nitro gtt titrated from 34mcg/hr to 51mcg/hr. MD notified.

## 2016-07-13 NOTE — Progress Notes (Signed)
*  PRELIMINARY RESULTS* Echocardiogram 2D Echocardiogram has been performed.  Roger Sutton 07/13/2016, 10:47 AM

## 2016-07-14 ENCOUNTER — Observation Stay (HOSPITAL_BASED_OUTPATIENT_CLINIC_OR_DEPARTMENT_OTHER): Payer: Medicare Other

## 2016-07-14 DIAGNOSIS — I10 Essential (primary) hypertension: Secondary | ICD-10-CM | POA: Diagnosis not present

## 2016-07-14 DIAGNOSIS — Z8673 Personal history of transient ischemic attack (TIA), and cerebral infarction without residual deficits: Secondary | ICD-10-CM

## 2016-07-14 DIAGNOSIS — E119 Type 2 diabetes mellitus without complications: Secondary | ICD-10-CM | POA: Diagnosis not present

## 2016-07-14 DIAGNOSIS — E785 Hyperlipidemia, unspecified: Secondary | ICD-10-CM | POA: Diagnosis not present

## 2016-07-14 DIAGNOSIS — R0789 Other chest pain: Secondary | ICD-10-CM

## 2016-07-14 DIAGNOSIS — R9431 Abnormal electrocardiogram [ECG] [EKG]: Secondary | ICD-10-CM

## 2016-07-14 DIAGNOSIS — I119 Hypertensive heart disease without heart failure: Secondary | ICD-10-CM | POA: Diagnosis not present

## 2016-07-14 DIAGNOSIS — R079 Chest pain, unspecified: Secondary | ICD-10-CM

## 2016-07-14 LAB — COMPREHENSIVE METABOLIC PANEL
ALT: 19 U/L (ref 17–63)
AST: 19 U/L (ref 15–41)
Albumin: 3.6 g/dL (ref 3.5–5.0)
Alkaline Phosphatase: 82 U/L (ref 38–126)
Anion gap: 6 (ref 5–15)
BUN: 18 mg/dL (ref 6–20)
CO2: 26 mmol/L (ref 22–32)
Calcium: 8.3 mg/dL — ABNORMAL LOW (ref 8.9–10.3)
Chloride: 107 mmol/L (ref 101–111)
Creatinine, Ser: 1.15 mg/dL (ref 0.61–1.24)
GFR calc Af Amer: >60 mL/min (ref >60)
GFR calc non Af Amer: 59 mL/min — ABNORMAL LOW (ref >60)
Glucose, Bld: 104 mg/dL — ABNORMAL HIGH (ref 65–99)
Potassium: 4.2 mmol/L (ref 3.5–5.1)
Sodium: 139 mmol/L (ref 135–145)
Total Bilirubin: 0.9 mg/dL (ref 0.3–1.2)
Total Protein: 6.1 g/dL — ABNORMAL LOW (ref 6.5–8.1)

## 2016-07-14 LAB — HEPARIN LEVEL (UNFRACTIONATED): Heparin Unfractionated: 0.52 IU/mL (ref 0.30–0.70)

## 2016-07-14 LAB — CBC
HCT: 46.9 % (ref 39.0–52.0)
Hemoglobin: 15.3 g/dL (ref 13.0–17.0)
MCH: 29.8 pg (ref 26.0–34.0)
MCHC: 32.6 g/dL (ref 30.0–36.0)
MCV: 91.2 fL (ref 78.0–100.0)
Platelets: 245 10*3/uL (ref 150–400)
RBC: 5.14 MIL/uL (ref 4.22–5.81)
RDW: 14.8 % (ref 11.5–15.5)
WBC: 6.7 10*3/uL (ref 4.0–10.5)

## 2016-07-14 LAB — NM MYOCAR MULTI W/SPECT W/WALL MOTION / EF
Peak HR: 94 {beats}/min
Rest HR: 69 {beats}/min

## 2016-07-14 LAB — TROPONIN I: Troponin I: <0.03 ng/mL (ref <0.03)

## 2016-07-14 MED ORDER — TECHNETIUM TC 99M TETROFOSMIN IV KIT
10.0000 | PACK | Freq: Once | INTRAVENOUS | Status: AC | PRN
Start: 2016-07-14 — End: 2016-07-14
  Administered 2016-07-14: 10 via INTRAVENOUS

## 2016-07-14 MED ORDER — TECHNETIUM TC 99M TETROFOSMIN IV KIT
30.0000 | PACK | Freq: Once | INTRAVENOUS | Status: AC | PRN
  Administered 2016-07-14: 30 via INTRAVENOUS

## 2016-07-14 MED ORDER — REGADENOSON 0.4 MG/5ML IV SOLN
INTRAVENOUS | Status: AC
  Administered 2016-07-14: 0.4 mg via INTRAVENOUS
  Filled 2016-07-14: qty 5

## 2016-07-14 MED ORDER — SODIUM CHLORIDE 0.9% FLUSH
INTRAVENOUS | Status: AC
  Administered 2016-07-14: 10 mL via INTRAVENOUS
  Filled 2016-07-14: qty 10

## 2016-07-14 MED ORDER — REGADENOSON 0.4 MG/5ML IV SOLN
0.4000 mg | Freq: Once | INTRAVENOUS | Status: AC
  Administered 2016-07-14: 0.4 mg via INTRAVENOUS
  Filled 2016-07-14: qty 5

## 2016-07-14 NOTE — Discharge Summary (Signed)
Physician Discharge Summary  Roger Sutton NAT:[id number] DOB: 06-08-1935 DOA: 07/13/2016  PCP: Andres Shad, MD  Admit date: 07/13/2016 Discharge date: 07/14/2016  Recommendations for Outpatient Follow-up:  1. Abnormal nuclear study as compared to echocardiogram. Recent hospitalization for chest pain.   Follow-up Information    Herminio Commons, MD Follow up.   Specialty:  Cardiology Why:  office will contact you with appointment Contact information: [place] [address] [phone]           Discharge Diagnoses:  1. Chest pain  Discharge Condition: improved Disposition: home  Diet recommendation: heart healthy  Filed Weights   07/13/16 0414 07/13/16 0900 07/14/16 0500  Weight: 80.3 kg (177 lb) 78.8 kg (173 lb 11.6 oz) 77.5 kg (170 lb 13.7 oz)    History of present illness:  81 year old man presented with chest pain, was treated with nitroglycerin and heparin in the emergency department and placed in observation. Seen by cardiology.  Hospital Course:  Patient was observed, troponins were negative, pain resolved. Echocardiogram was reassuring. He was seen by cardiology and underwent nuclear stress testing which was an intermediate risk study, I discussed the case with the cardiologist who recommended discharge home, continue aspirin and Plavix in close outpatient follow-up next week. Discussed in detail with patient and wife at bedside. Hospitalization was uncomplicated.  Consultants:  Cardiology   Procedures:  2-d echocardiogram Study Conclusions  - Left ventricle: The cavity size was normal. Wall thickness was   increased in a pattern of mild LVH. Systolic function was normal.   The estimated ejection fraction was in the range of 55% to 60%.   Wall motion was normal; there were no regional wall motion   abnormalities. There was an increased relative contribution of   atrial contraction to ventricular filling. - Aortic  valve: There was trivial regurgitation. - Mitral valve: There was moderate regurgitation.  Today's assessment: S: Feels well. O: Vitals: Temperature 98.2, afebrile, respirations 19, pulse 68, blood pressure 134/73. SPO2 94% on room air.  Exam:     Constitutional: Appears calm, comfortable.  Cardiovascular: Regular rate and rhythm. No murmur, rub or gallop. No lower extremity edema.  Respiratory: Clear to auscultation bilaterally. No wheezes, rales or rhonchi. Normal respiratory effort.  Psychiatric: Grossly normal mood and affect. Speech fluent and appropriate.  Discharge Instructions  Discharge Instructions    Diet - low sodium heart healthy    Complete by:  As directed    Discharge instructions    Complete by:  As directed    Call your physician or seek immediate medical attention for chest pain, shortness of breath or worsening of condition.   Increase activity slowly    Complete by:  As directed      Allergies as of 07/14/2016   No Known Allergies     Medication List    TAKE these medications   acetaminophen 500 MG tablet Commonly known as:  TYLENOL Take 500 mg by mouth every 6 (six) hours as needed for pain.   aspirin EC 81 MG tablet Take 81 mg by mouth daily.   clopidogrel 75 MG tablet Commonly known as:  PLAVIX Take 75 mg by mouth daily.   omeprazole 20 MG capsule Commonly known as:  PRILOSEC Take 20 mg by mouth daily.   timolol 0.5 % ophthalmic solution Commonly known as:  BETIMOL Place 1 drop into both eyes daily.   traMADol 50 MG tablet Commonly known as:  ULTRAM Take 50 mg by  mouth every 4 (four) hours as needed. For pain   Vitamin D3 2000 units Tabs Take 1 tablet by mouth daily.      No Known Allergies  The results of significant diagnostics from this hospitalization (including imaging, microbiology, ancillary and laboratory) are listed below for reference.    Significant Diagnostic Studies: Nm Myocar Multi W/spect W/wall Motion /  Ef  Result Date: 07/14/2016  There was no ST segment deviation noted during stress.  Defect 1: There is a medium defect of moderate severity present in the basal inferoseptal, basal inferior, mid inferoseptal, mid inferior, mid inferolateral and apical inferior location.  Findings consistent with prior myocardial infarction.  This is an intermediate risk study.  Nuclear stress EF: 42%.    Dg Chest Portable 1 View  Result Date: 07/13/2016 CLINICAL DATA:  Chest pain, onset today. EXAM: PORTABLE CHEST 1 VIEW COMPARISON:  Radiograph 02/02/2013.  CT 12/14/2012 FINDINGS: Lower lung volumes from prior exam. There is peribronchial thickening. Unchanged elevation of right hemidiaphragm allowing differences in technique. Bibasilar atelectasis. The heart is normal in size. Mediastinal contours are unchanged allowing for rotation. No pulmonary edema. No large pleural effusion. No pneumothorax. No evidence of acute osseous abnormalities. IMPRESSION: Peribronchial thickening, may be infectious or inflammatory. Bibasilar atelectasis. Electronically Signed   By: Jeb Levering M.D.   On: 07/13/2016 05:06    Microbiology: Recent Results (from the past 240 hour(s))  MRSA PCR Screening     Status: None   Collection Time: 07/13/16  6:26 AM  Result Value Ref Range Status   MRSA by PCR NEGATIVE NEGATIVE Final    Comment:        The GeneXpert MRSA Assay (FDA approved for NASAL specimens only), is one component of a comprehensive MRSA colonization surveillance program. It is not intended to diagnose MRSA infection nor to guide or monitor treatment for MRSA infections.      Labs: Basic Metabolic Panel:  Recent Labs Lab 07/13/16 0428 07/13/16 0519 07/14/16 0441  NA 140  --  139  K 3.7  --  4.2  CL 106  --  107  CO2 26  --  26  GLUCOSE 102*  --  104*  BUN 21*  --  18  CREATININE 1.14  --  1.15  CALCIUM 8.4*  --  8.3*  MG  --  1.9  --    Liver Function Tests:  Recent Labs Lab  07/14/16 0441  AST 19  ALT 19  ALKPHOS 82  BILITOT 0.9  PROT 6.1*  ALBUMIN 3.6   CBC:  Recent Labs Lab 07/13/16 0519 07/14/16 0441  WBC 6.1 6.7  HGB 14.7 15.3  HCT 43.6 46.9  MCV 90.6 91.2  PLT 221 245   Cardiac Enzymes:  Recent Labs Lab 07/13/16 0935 07/13/16 1427 07/14/16 0441  TROPONINI <0.03 <0.03 <0.03    Principal Problem:   Chest pain Active Problems:   GERD (gastroesophageal reflux disease)   Glaucoma   Hyperlipidemia   Time coordinating discharge: 35 minutes  Signed:  Murray Hodgkins, MD [facility] 07/14/2016, 4:09 PM

## 2016-07-14 NOTE — Care Management Obs Status (Signed)
[place] NOTIFICATION   Patient Details  Name: Roger Sutton MRN: [id number] Date of Birth: [birth year]   Medicare Observation Status Notification Given:  Yes    [name], [name], RN 07/14/2016, 9:48 AM

## 2016-07-14 NOTE — Progress Notes (Signed)
The nuclear stress test indicated there is inferior and inferoseptal wall scar without ischemic, with reduced LV systolic function. The echocardiogram, however, showed normal LV systolic function and regional wall motion.  I discussed the findings with Dr. Sarajane Jews.  He will be maintained on ASA and Plavix. He denies chest pain and has not had any all day as per nursing staff.  I will make an appointment for him to see me next Monday in our office. At that time, I may repeat a limited echocardiogram with contrast to see if there has been an interval change in LVEF and regional wall motion.

## 2016-07-14 NOTE — Progress Notes (Signed)
  PROGRESS NOTE  Roger Sutton [id number]:[id number] DOB: [birth year] DOA: 07/13/2016 PCP: Andres Shad, MD  Brief Narrative: 81 year old man presented with chest pain, was treated with nitroglycerin and heparin in the emergency department and placed in observation. Seen by cardiology.  Assessment/Plan #1: Chest pain. Has ruled out for ACS. Troponins negative. EKG nonacute. -Cardiology plans nuclear stress testing today. -Heparin discontinued  #2: Hyperlipidemia. Stable. -Continue statin  #3: PMH TIA. On aspirin and Plavix as an outpatient.  Likely home later today if stress testing negative.   DVT prophylaxis: heparin Code Status: full Family Communication:  Disposition Plan: home    Murray Hodgkins, MD  [facility] Direct contact: [phone] --Via [place]  --[email or web address]; password [email or web address]  7PM-7AM contact night coverage as above 07/14/2016, 7:20 AM  LOS: 0 days   Consultants:  Cardiology   Procedures:    Antimicrobials:    Interval history/Subjective: Feels well except for some mild soreness in his left shoulder. No chest pain or shortness of breath.  Objective: Vitals: Temperature 98.2, afebrile, respirations 19, pulse 68, blood pressure 134/73. SPO2 94% on room air.  Exam:     Constitutional: Appears calm, comfortable.  Cardiovascular: Regular rate and rhythm. No murmur, rub or gallop. No lower extremity edema.  Respiratory: Clear to auscultation bilaterally. No wheezes, rales or rhonchi. Normal respiratory effort.  Psychiatric: Grossly normal mood and affect. Speech fluent and appropriate.   I have personally reviewed the following:   Labs:  Troponins negative.  Complete metabolic panel unremarkable  CBC within normal limits  Imaging studies:    Medical tests:  EKG independently reviewed. Sinus rhythm.  Test discussed with performing physician:    Decision to obtain old records:    Review and summation of  old records:    Scheduled Meds: . clopidogrel  75 mg Oral Daily  . pantoprazole  40 mg Oral Daily  . timolol  1 drop Both Eyes BID   Continuous Infusions: . heparin 1,050 Units/hr (07/14/16 0150)    Principal Problem:   Chest pain Active Problems:   GERD (gastroesophageal reflux disease)   Glaucoma   Hyperlipidemia   LOS: 0 days

## 2016-07-14 NOTE — Progress Notes (Signed)
Progress Note  Patient Name: Roger Sutton Date of Encounter: 07/14/2016  Primary Cardiologist: Kate Sable, MD   Subjective   Some vague, atypical discomfort left chest, worse with movement.  Inpatient Medications    Scheduled Meds: . clopidogrel  75 mg Oral Daily  . pantoprazole  40 mg Oral Daily  . timolol  1 drop Both Eyes BID   Continuous Infusions: . heparin 1,050 Units/hr (07/14/16 0150)   PRN Meds: acetaminophen, ALPRAZolam, ondansetron (ZOFRAN) IV   Vital Signs    Vitals:   07/14/16 0300 07/14/16 0400 07/14/16 0500 07/14/16 0600  BP: 124/68 131/76    Pulse: 70 67 68 (!) 52  Resp: 19 (!) 21 19 17   Temp:  98.2 F (36.8 C)    TempSrc:  Oral    SpO2: 93% 95% 94% 96%  Weight:   170 lb 13.7 oz (77.5 kg)   Height:        Intake/Output Summary (Last 24 hours) at 07/14/16 0744 Last data filed at 07/13/16 2100  Gross per 24 hour  Intake          1308.31 ml  Output              201 ml  Net          1107.31 ml   Filed Weights   07/13/16 0414 07/13/16 0900 07/14/16 0500  Weight: 177 lb (80.3 kg) 173 lb 11.6 oz (78.8 kg) 170 lb 13.7 oz (77.5 kg)    Telemetry    NSR.  Personally Reviewed   ECG    NSR, non-specific ST-T wave abnormalities, LAFB, Personally Reviewed  Physical Exam   GEN: No acute distress.   Neck: No JVD Cardiac: RRR, no murmurs, rubs, or gallops.  Respiratory: Clear to auscultation bilaterally. GI: Soft, nontender, non-distended  MS: No edema; No deformity.  Labs    Chemistry Recent Labs Lab 07/13/16 0428 07/14/16 0441  NA 140 139  K 3.7 4.2  CL 106 107  CO2 26 26  GLUCOSE 102* 104*  BUN 21* 18  CREATININE 1.14 1.15  CALCIUM 8.4* 8.3*  PROT  --  6.1*  ALBUMIN  --  3.6  AST  --  19  ALT  --  19  ALKPHOS  --  82  BILITOT  --  0.9  GFRNONAA 60* 59*  GFRAA >60 >60  ANIONGAP 8 6     Hematology Recent Labs Lab 07/13/16 0519 07/14/16 0441  WBC 6.1 6.7  RBC 4.81 5.14  HGB 14.7 15.3  HCT 43.6 46.9    MCV 90.6 91.2  MCH 30.6 29.8  MCHC 33.7 32.6  RDW 14.6 14.8  PLT 221 245    Cardiac Enzymes Recent Labs Lab 07/13/16 0935 07/13/16 1427  TROPONINI <0.03 <0.03    Recent Labs Lab 07/13/16 0443  TROPIPOC 0.01     Radiology    Dg Chest Portable 1 View  Result Date: 07/13/2016 CLINICAL DATA:  Chest pain, onset today. EXAM: PORTABLE CHEST 1 VIEW COMPARISON:  Radiograph 02/02/2013.  CT 12/14/2012 FINDINGS: Lower lung volumes from prior exam. There is peribronchial thickening. Unchanged elevation of right hemidiaphragm allowing differences in technique. Bibasilar atelectasis. The heart is normal in size. Mediastinal contours are unchanged allowing for rotation. No pulmonary edema. No large pleural effusion. No pneumothorax. No evidence of acute osseous abnormalities. IMPRESSION: Peribronchial thickening, may be infectious or inflammatory. Bibasilar atelectasis. Electronically Signed   By: Jeb Levering M.D.   On: 07/13/2016 05:06  Cardiac Studies   Echocardiogram (07/13/16)  - Left ventricle: The cavity size was normal. Wall thickness was increased in a pattern of mild LVH. Systolic function was normal. The estimated ejection fraction was in the range of 55% to 60%. Wall motion was normal; there were no regional wall motion abnormalities. There was an increased relative contribution of atrial contraction to ventricular filling. - Aortic valve: There was trivial regurgitation. - Mitral valve: There was moderate regurgitation.   Patient Profile     81 y.o. male with history of TIA, hyperlipidemia, GERD, admitted with chest pain which awakened him and fatigue. Has been ruled out for ACS with normal troponin I levels. ECG abnormal but nonspecific. Lexiscan Myoview planned for today.  Assessment & Plan    1. Chest Pain: Has been ruled out for ACS. Will plan Lexiscan MPI for today. Troponin negative, chest pain is reproducible with movement of upper torso and  palpation, but continues to feel it without movement. Will stop heparin as troponin is negative echocardiogram negative for WMA.   2. Hx of TIA: On ASA and Plavix. ASA stopped.   3. Hypertension: Stable at present. Not on antihypertensive medications at this time.    Signed, Phill Myron. [place] Pugh, ANP, AACC   07/14/2016, 7:44 AM     Attending note:  Patient seen and examined. Reviewed consultation note and plan by Dr. Bronson Ing. Patient has had fairly atypical chest discomfort, troponin I levels negative for ACS. Cardiac risk factors include gender and age, history of TIA, hypertension. Also has abnormal but nonspecific ECG at baseline. Plan is to proceed with [facility] today.  Satira Sark, M.D., F.A.C.C.

## 2016-07-14 NOTE — Discharge Instructions (Signed)
Acute Coronary Syndrome °Acute coronary syndrome (ACS) is a serious problem in which there is suddenly not enough blood and oxygen supplied to the heart. ACS may mean that one or more of the blood vessels in your heart (coronary arteries) may be blocked. ACS can result in chest pain or a heart attack (myocardial infarction or MI). °What are the causes? °This condition is caused by atherosclerosis, which is the buildup of fat and cholesterol (plaque) on the inside of the arteries. Over time, the plaque may narrow or block the artery, and this will lessen blood flow to the heart. Plaque can also become weak and break off within a coronary artery to form a clot and cause a sudden blockage. °What increases the risk? °The risk factors of this condition include: °· High cholesterol levels. °· High blood pressure (hypertension). °· Smoking. °· Diabetes. °· Age. °· Family history of chest pain, heart disease, or stroke. °· Lack of exercise. °What are the signs or symptoms? °The most common signs of this condition include: °· Chest pain, which can be: °¨ A crushing or squeezing in the chest. °¨ A tightness, pressure, fullness, or heaviness in the chest. °¨ Present for more than a few minutes, or it can stop and recur. °· Pain in the arms, neck, jaw, or back. °· Unexplained heartburn or indigestion. °· Shortness of breath. °· Nausea. °· Sudden cold sweats. °· Feeling light-headed or dizzy. °Sometimes, this condition has no symptoms. °How is this diagnosed? °ACS may be diagnosed through the following tests: °· Electrocardiogram (ECG). °· Blood tests. °· Coronary angiogram. This is a procedure to look at the coronary arteries to see if there is any blockage. °How is this treated? °Treatment for ACS may include: °· Healthy behavioral changes to reduce or control risk factors. °· Medicine. °· Coronary stenting. A stent helps to keep an artery open. °· Coronary angioplasty. This procedure widens a narrowed or blocked  artery. °· Coronary artery bypass surgery. This will allow your blood to pass the blockage (bypass) to reach your heart. °Follow these instructions at home: °Eating and drinking °· Follow a heart-healthy diet. A dietitian can you help to educate you about healthy food options and changes. °· Use healthy cooking methods such as roasting, grilling, broiling, baking, poaching, steaming, or stir-frying. Talk to a dietitian to learn more about healthy cooking methods. °Medicines °· Take medicines only as directed by your health care provider. °· Do not take the following medicines unless your health care provider approves: °¨ Nonsteroidal anti-inflammatory drugs (NSAIDs), such as ibuprofen, naproxen, or celecoxib. °¨ Vitamin supplements that contain vitamin A, vitamin E, or both. °¨ Hormone replacement therapy that contains estrogen with or without progestin. °· Stop illegal drug use. °Activity °· Follow an exercise program that is approved by your health care provider. °· Plan rest periods when you are fatigued. °Lifestyle °· Do not use any tobacco products, including cigarettes, chewing tobacco, or electronic cigarettes. If you need help quitting, ask your health care provider. °· If you drink alcohol, and your health care provider approves, limit your alcohol intake to no more than 1 drink per day. One drink equals 12 ounces of beer, 5 ounces of wine, or 1½ ounces of hard liquor. °· Learn to manage stress. °· Maintain a healthy weight. Lose weight as approved by your health care provider. °General instructions °· Manage other health conditions, such as hypertension and diabetes, as directed by your health care provider. °· Keep all follow-up visits as directed by your   health care provider. This is important. °· Your health care provider may ask you to monitor your blood pressure. A blood pressure reading consists of a higher number over a lower number, such as 110 over 72, written as 110/72. Ideally, your blood  pressure should be: °¨ Below 140/90 if you have no other medical conditions. °¨ Below 130/80 if you have diabetes or kidney disease. °Get help right away if: °· You have pain in your chest, neck, arm, jaw, stomach, or back that lasts more than a few minutes, is recurring, or is not relieved by taking medicine under your tongue (sublingual nitroglycerin). °· You have profuse sweating without cause. °· You have unexplained: °¨ Heartburn or indigestion. °¨ Shortness of breath or difficulty breathing. °¨ Nausea or vomiting. °¨ Fatigue. °¨ Feelings of nervousness or anxiety. °¨ Weakness. °¨ Diarrhea. °· You have sudden light-headedness or dizziness. °· You faint. °These symptoms may represent a serious problem that is an emergency. Do not wait to see if the symptoms will go away. Get medical help right away. Call your local emergency services (911 in the [place] Do not drive yourself to the clinic or hospital.  °This information is not intended to replace advice given to you by your health care provider. Make sure you discuss any questions you have with your health care provider. °Document Released: 02/16/2005 Document Revised: 07/31/2015 Document Reviewed: 06/20/2013 °Elsevier Interactive Patient Education © 2017 [employer] ° °

## 2016-07-20 ENCOUNTER — Encounter: Payer: Self-pay | Admitting: Cardiovascular Disease

## 2016-07-20 ENCOUNTER — Ambulatory Visit (INDEPENDENT_AMBULATORY_CARE_PROVIDER_SITE_OTHER): Payer: Medicare Other | Admitting: Cardiovascular Disease

## 2016-07-20 VITALS — BP 124/74 | HR 75 | Ht 68.0 in | Wt 172.0 lb

## 2016-07-20 DIAGNOSIS — Z9289 Personal history of other medical treatment: Secondary | ICD-10-CM | POA: Diagnosis not present

## 2016-07-20 DIAGNOSIS — R079 Chest pain, unspecified: Secondary | ICD-10-CM

## 2016-07-20 DIAGNOSIS — I249 Acute ischemic heart disease, unspecified: Secondary | ICD-10-CM | POA: Diagnosis not present

## 2016-07-20 DIAGNOSIS — I340 Nonrheumatic mitral (valve) insufficiency: Secondary | ICD-10-CM | POA: Diagnosis not present

## 2016-07-20 DIAGNOSIS — R9439 Abnormal result of other cardiovascular function study: Secondary | ICD-10-CM | POA: Diagnosis not present

## 2016-07-20 NOTE — Progress Notes (Signed)
SUBJECTIVE: The patient presents for follow-up after being hospitalized for chest pain. Troponins were normal.   Echocardiogram on 07/13/16 demonstrated normal left ventricular systolic function and regional wall motion, LVEF 55-60%, with moderate mitral regurgitation.  Nuclear stress test however demonstrated a medium-sized defect of moderate severity in the basal inferoseptal, basal inferior, mid inferoseptal, mid inferior, mid inferolateral, and apical inferior regions which was fixed consistent with prior myocardial infarction, calculated LVEF 42%.  He has a history of TIA and has been on aspirin and Plavix.  The patient denies any symptoms of chest pain, palpitations, shortness of breath, lightheadedness, dizziness, leg swelling, orthopnea, PND, and syncope.  They are planning on going to [place], [place], in mid June with their daughter who lives in [place].    Review of Systems: As per "subjective", otherwise negative.  No Known Allergies  Current Outpatient Prescriptions  Medication Sig Dispense Refill  . acetaminophen (TYLENOL) 500 MG tablet Take 500 mg by mouth every 6 (six) hours as needed for pain.    Marland Kitchen aspirin EC 81 MG tablet Take 81 mg by mouth daily.    . Cholecalciferol (VITAMIN D3) 2000 units TABS Take 1 tablet by mouth daily.    . clopidogrel (PLAVIX) 75 MG tablet Take 75 mg by mouth daily.    Marland Kitchen omeprazole (PRILOSEC) 20 MG capsule Take 20 mg by mouth daily.    . timolol (BETIMOL) 0.5 % ophthalmic solution Place 1 drop into both eyes daily.      No current facility-administered medications for this visit.     Past Medical History:  Diagnosis Date  . Cancer [facility])    prostate  . Complication of anesthesia    Pt had a difficult time swallowing after surgery.  lost 20 lbs after hernia surgery  . GERD (gastroesophageal reflux disease)   . Glaucoma   . Hyperlipidemia   . Osteoarthritis of glenohumeral joint 12/15/2012  . TIA (transient ischemic  attack)     Past Surgical History:  Procedure Laterality Date  . HERNIA REPAIR Right 2015  . INGUINAL HERNIA REPAIR Left 10/08/2014   Procedure: LEFT INGUINAL HERNIORRHAPHY;  Surgeon: Aviva Signs, MD;  Location: [facility];  Service: General;  Laterality: Left;  . INSERTION OF MESH Left 10/08/2014   Procedure: INSERTION OF MESH;  Surgeon: Aviva Signs, MD;  Location: [facility];  Service: General;  Laterality: Left;  . KNEE SURGERY    . PROSTATECTOMY      Social History   Social History  . Marital status: Married    Spouse name: N/A  . Number of children: N/A  . Years of education: N/A   Occupational History  . Not on file.   Social History Main Topics  . Smoking status: Never Smoker  . Smokeless tobacco: Never Used  . Alcohol use No  . Drug use: No  . Sexual activity: Not Currently   Other Topics Concern  . Not on file   Social History Narrative  . No narrative on file     Vitals:   07/20/16 1356  BP: 124/74  Pulse: 75  SpO2: 95%  Weight: 172 lb (78 kg)  Height: 5\' 8"  (1.727 m)    Wt Readings from Last 3 Encounters:  07/20/16 172 lb (78 kg)  07/14/16 170 lb 13.7 oz (77.5 kg)  10/08/14 171 lb (77.6 kg)     PHYSICAL EXAM General: NAD HEENT: Normal. Neck: No JVD, no thyromegaly. Lungs: Clear to auscultation bilaterally with normal  respiratory effort. CV: Nondisplaced PMI.  Regular rate and rhythm, normal S1/S2, no S3/S4, 2/6 apical holosystolic murmur. No pretibial or periankle edema.  No carotid bruit.   Abdomen: Soft, nontender, no distention.  Neurologic: Alert and oriented.  Psych: Normal affect. Skin: Normal. Musculoskeletal: No gross deformities.    ECG: Most recent ECG reviewed.   Labs: Lab Results  Component Value Date/Time   K 4.2 07/14/2016 04:41 AM   BUN 18 07/14/2016 04:41 AM   CREATININE 1.15 07/14/2016 04:41 AM   ALT 19 07/14/2016 04:41 AM   TSH 1.116 12/14/2012 09:55 PM   HGB 15.3 07/14/2016 04:41 AM     Lipids: No results found  for: LDLCALC, LDLDIRECT, CHOL, TRIG, HDL     ASSESSMENT AND PLAN: 1. Chest pain: No recurrences. I will repeat a limited echocardiogram as detailed below to confirm findings of abnormal nuclear stress test. For now, continue aspirin and Plavix. If the limited echocardiogram with contrast demonstrates normal left ventricular systolic function and regional wall motion, aspirin can be discontinued.  2. Abnormal nuclear stress test: Echocardiogram demonstrated normal left ventricular systolic function and regional wall motion, but LVEF was mildly reduced at 42% by nuclear stress test. It also showed evidence of prior myocardial infarction. I will repeat a limited echocardiogram with contrast to assess for interval change in left ventricular systolic function and/or regional wall motion.  3. Mitral regurgitation: Moderate. Will monitor.    Disposition: Follow up 3 months.   Kate Sable, M.D., F.A.C.C.

## 2016-07-20 NOTE — Patient Instructions (Signed)

## 2016-07-22 ENCOUNTER — Ambulatory Visit (HOSPITAL_COMMUNITY)
Admission: RE | Admit: 2016-07-22 | Discharge: 2016-07-22 | Disposition: A | Discharge status: Home / Self Care | Payer: Medicare Other | Source: Ambulatory Visit | Attending: Cardiovascular Disease | Admitting: Cardiovascular Disease

## 2016-07-22 DIAGNOSIS — I340 Nonrheumatic mitral (valve) insufficiency: Secondary | ICD-10-CM | POA: Insufficient documentation

## 2016-07-22 DIAGNOSIS — R9439 Abnormal result of other cardiovascular function study: Secondary | ICD-10-CM | POA: Diagnosis present

## 2016-07-22 NOTE — Progress Notes (Signed)
*  PRELIMINARY RESULTS* Echocardiogram 2D Echocardiogram has been performed.  Roger Sutton 07/22/2016, 1:57 PM

## 2016-10-14 ENCOUNTER — Emergency Department (HOSPITAL_COMMUNITY)
Admission: EM | Admit: 2016-10-14 | Discharge: 2016-10-14 | Disposition: A | Discharge status: Home / Self Care | Payer: Medicare Other | Attending: Emergency Medicine | Admitting: Emergency Medicine

## 2016-10-14 ENCOUNTER — Emergency Department (HOSPITAL_COMMUNITY): Payer: Medicare Other

## 2016-10-14 ENCOUNTER — Encounter (HOSPITAL_COMMUNITY): Payer: Self-pay | Admitting: Cardiology

## 2016-10-14 DIAGNOSIS — Z7982 Long term (current) use of aspirin: Secondary | ICD-10-CM | POA: Insufficient documentation

## 2016-10-14 DIAGNOSIS — Z7902 Long term (current) use of antithrombotics/antiplatelets: Secondary | ICD-10-CM | POA: Diagnosis not present

## 2016-10-14 DIAGNOSIS — Z79899 Other long term (current) drug therapy: Secondary | ICD-10-CM | POA: Insufficient documentation

## 2016-10-14 DIAGNOSIS — M545 Low back pain: Secondary | ICD-10-CM | POA: Diagnosis present

## 2016-10-14 DIAGNOSIS — M5442 Lumbago with sciatica, left side: Secondary | ICD-10-CM | POA: Insufficient documentation

## 2016-10-14 DIAGNOSIS — Z8546 Personal history of malignant neoplasm of prostate: Secondary | ICD-10-CM | POA: Diagnosis not present

## 2016-10-14 MED ORDER — IBUPROFEN 400 MG PO TABS
600.0000 mg | ORAL_TABLET | Freq: Once | ORAL | Status: AC
  Administered 2016-10-14: 600 mg via ORAL
  Filled 2016-10-14: qty 2

## 2016-10-14 NOTE — ED Triage Notes (Signed)
Lower back pain that goes down left leg since last night.  States he had a fall a couple of days ago.

## 2016-10-14 NOTE — ED Provider Notes (Signed)
[facility] [place] DEPT Provider Note   CSN: [id number] Arrival date & time: 10/14/16  1031     History   Chief Complaint Chief Complaint  Patient presents with  . Leg Pain    HPI Roger Sutton is a 81 y.o. male with multiple comorbidities, presents with acute onset of left sided low back pain with radiation into left leg, since last night. Pt states he has had intermittent back pain in the same location and same nature before, however it has worsened. He reports a mechanical fall 2 days ago onto his right side, without head trauma. States he was fine following fall, however thinks it exacerbated his chronic back pain. Reports pain is sharp, worse with ambulation, mildly relieved by tylenol. Localizes pain to left side of lower back with radiation to left groin and down anterior thigh to knee. Rates pain 8/10. Denies N/T, saddle paresthesia, bowel or bladder incontinence, weakness, or any other symptoms.  The history is provided by the patient.    Past Medical History:  Diagnosis Date  . Cancer [facility])    prostate  . Complication of anesthesia    Pt had a difficult time swallowing after surgery.  lost 20 lbs after hernia surgery  . GERD (gastroesophageal reflux disease)   . Glaucoma   . Hyperlipidemia   . Osteoarthritis of glenohumeral joint 12/15/2012  . TIA (transient ischemic attack)     Patient Active Problem List   Diagnosis Date Noted  . Glaucoma 07/13/2016  . Hyperlipidemia 07/13/2016  . Osteoarthritis of glenohumeral joint 12/15/2012  . Chest pain 12/14/2012  . Inguinal hernia 12/14/2012  . GERD (gastroesophageal reflux disease) 12/14/2012    Past Surgical History:  Procedure Laterality Date  . HERNIA REPAIR Right 2015  . INGUINAL HERNIA REPAIR Left 10/08/2014   Procedure: LEFT INGUINAL HERNIORRHAPHY;  Surgeon: Aviva Signs, MD;  Location: [facility];  Service: General;  Laterality: Left;  . INSERTION OF MESH Left 10/08/2014   Procedure: INSERTION OF MESH;  Surgeon:  Aviva Signs, MD;  Location: [facility];  Service: General;  Laterality: Left;  . KNEE SURGERY    . PROSTATECTOMY         Home Medications    Prior to Admission medications   Medication Sig Start Date End Date Taking? Authorizing Provider  acetaminophen (TYLENOL) 500 MG tablet Take 500 mg by mouth every 6 (six) hours as needed for pain.    [provider]  aspirin EC 81 MG tablet Take 81 mg by mouth daily.    [provider]  Cholecalciferol (VITAMIN D3) 2000 units TABS Take 1 tablet by mouth daily.    [provider]  clopidogrel (PLAVIX) 75 MG tablet Take 75 mg by mouth daily.    [provider]  omeprazole (PRILOSEC) 20 MG capsule Take 20 mg by mouth daily.    [provider]  timolol (BETIMOL) 0.5 % ophthalmic solution Place 1 drop into both eyes daily.     [provider]    Family History Family History  Problem Relation Age of Onset  . Heart attack Father   . Diabetes Sister   . Breast cancer Sister     Social History Social History  Substance Use Topics  . Smoking status: Never Smoker  . Smokeless tobacco: Never Used  . Alcohol use No     Allergies   Patient has no known allergies.   Review of Systems Review of Systems  Constitutional: Negative for fever.  Gastrointestinal: Negative for abdominal  pain.       No bowel incontinence  Genitourinary: Negative for difficulty urinating, flank pain and testicular pain.  Musculoskeletal: Positive for arthralgias and back pain. Negative for gait problem.  Neurological: Negative for syncope, weakness and numbness.  All other systems reviewed and are negative.    Physical Exam Updated Vital Signs BP (!) 156/81   Pulse 67   Temp 98 F (36.7 C) (Oral)   Resp 16   Ht 5\' 8"  (1.727 m)   Wt 77.1 kg (170 lb)   SpO2 95%   BMI 25.85 kg/m   Physical Exam  Constitutional: He is oriented to person, place, and time. He appears well-developed and well-nourished. No  distress.  HENT:  Head: Normocephalic and atraumatic.  Eyes: Conjunctivae are normal.  Neck: Normal range of motion. Neck supple.  Cardiovascular: Normal rate and intact distal pulses.   Pulmonary/Chest: Effort normal.  Abdominal: Soft.  Musculoskeletal:  Mild L-spine tenderness, no C or T-spine tenderness. Left lower back tenderness and into gluteus. No bony step-offs, no gross deformities. Pelvis is stable. Nl ROM of hip and lower extremities.  Neurological: He is alert and oriented to person, place, and time. He displays normal reflexes.  5/5 strength bilateral upper and lower extremities. Normal sensation. Normal gait.  Skin: Skin is warm.  Psychiatric: He has a normal mood and affect. His behavior is normal.  Nursing note and vitals reviewed.    ED Treatments / Results  Labs (all labs ordered are listed, but only abnormal results are displayed) Labs Reviewed - No data to display  EKG  EKG Interpretation None       Radiology Dg Lumbar Spine Complete  Result Date: 10/14/2016 CLINICAL DATA:  Less post fall several days ago with persistent low back pain radiating into the left groin. EXAM: LUMBAR SPINE - COMPLETE 4+ VIEW COMPARISON:  No previous studies available for review. FINDINGS: There is mild curvature centered in the mid lumbar spine convex toward the right. The lumbar vertebral bodies are preserved in height. The pedicles and transverse processes are grossly intact. There is no spondylolisthesis. There is multilevel moderate degenerative disc space narrowing with relative sparing of L1-2. There is mild facet joint hypertrophy at L5-S1. The observed portions of the sacrum are normal. There is calcification in the wall of the abdominal aorta. IMPRESSION: No acute compression fracture or spondylolisthesis is observed. There is multilevel moderate degenerative disc space narrowing consistent with osteoarthritis. Facet joint hypertrophy at L5-S1 is also consistent with  osteoarthritis. Electronically Signed   By: David  Martinique M.D.   On: 10/14/2016 12:49   Dg Hip Unilat With Pelvis 2-3 Views Left  Result Date: 10/14/2016 CLINICAL DATA:  Status post fall.  Left groin pain. EXAM: DG HIP (WITH OR WITHOUT PELVIS) 2-3V LEFT COMPARISON:  None FINDINGS: There is no evidence of hip fracture or dislocation. There is no evidence of arthropathy or other focal bone abnormality. Degenerative disc disease noted within the lumbar spine. IMPRESSION: 1. No acute findings. 2. Lumbar degenerative disc disease. Electronically Signed   By: Kerby Moors M.D.   On: 10/14/2016 12:50    Procedures Procedures (including critical care time)  Medications Ordered in ED Medications  ibuprofen (ADVIL,MOTRIN) tablet 600 mg (600 mg Oral Given 10/14/16 1221)     Initial Impression / Assessment and Plan / ED Course  I have reviewed the triage vital signs and the nursing notes.  Pertinent labs & imaging results that were available during my care of the patient  were reviewed by me and considered in my medical decision making (see chart for details).     Pt with acute on chronic left-sided back pain s/p mechanical fall 2 days ago. Normal neurological exam, no evidence of urinary incontinence or retention, pain is consistently reproducible. Xray of L spine and hip/pelvis negative for acute pathology. L-spine xray showing chronic degenerative changes, which was discussed with the patient. There is no evidence of AAA or concern for dissection at this time. Patient can walk but states is painful.  No loss of bowel or bladder control.  No concern for cauda equina. Pain treated here in the department with adequate improvement. RICE protocol and pain medicine indicated and discussed with patient. Pt is safe for discharge w PCP follow up.  Patient discussed with Dr. Alvino Chapel.  Discussed results, findings, treatment and follow up. Patient advised of return precautions. Patient verbalized  understanding and agreed with plan.   Final Clinical Impressions(s) / ED Diagnoses   Final diagnoses:  Acute left-sided low back pain with left-sided sciatica    New Prescriptions Discharge Medication List as of 10/14/2016  1:11 PM       Russo, Martinique N, PA-C 10/14/16 [place], Nathan, MD 10/14/16 [phone]

## 2016-10-14 NOTE — Discharge Instructions (Signed)
Please read instructions below.  Follow up with your PCP about your intermittent back pain. You can take tylenol every 6 hours for pain.  Apply ice or heat to your back for added relief. Return to ER if new numbness or tingling in your arms or legs, inability to urinate, inability to hold your bowels, or weakness in your extremities.

## 2016-10-14 NOTE — ED Notes (Signed)
Pt c/o left groin pain, shooting down left leg.  Rates pain 9/10 to 10/10 at times.  Pt not able to ambulate.  [place] 2 days ago while in yard playing with nephew.  Denies any injury or pain until yesterday evening, took tylenol without relief.  Woke up this morning with severe pain to left groin, having difficulty walking.

## 2016-10-30 ENCOUNTER — Ambulatory Visit (INDEPENDENT_AMBULATORY_CARE_PROVIDER_SITE_OTHER): Payer: Medicare Other | Admitting: Cardiovascular Disease

## 2016-10-30 VITALS — BP 158/84 | HR 65 | Ht 68.0 in | Wt 167.8 lb

## 2016-10-30 DIAGNOSIS — R9439 Abnormal result of other cardiovascular function study: Secondary | ICD-10-CM

## 2016-10-30 DIAGNOSIS — Z8673 Personal history of transient ischemic attack (TIA), and cerebral infarction without residual deficits: Secondary | ICD-10-CM

## 2016-10-30 DIAGNOSIS — R079 Chest pain, unspecified: Secondary | ICD-10-CM

## 2016-10-30 DIAGNOSIS — I249 Acute ischemic heart disease, unspecified: Secondary | ICD-10-CM

## 2016-10-30 DIAGNOSIS — I340 Nonrheumatic mitral (valve) insufficiency: Secondary | ICD-10-CM | POA: Diagnosis not present

## 2016-10-30 DIAGNOSIS — M1990 Unspecified osteoarthritis, unspecified site: Secondary | ICD-10-CM | POA: Diagnosis not present

## 2016-10-30 DIAGNOSIS — M79605 Pain in left leg: Secondary | ICD-10-CM

## 2016-10-30 DIAGNOSIS — I10 Essential (primary) hypertension: Secondary | ICD-10-CM | POA: Diagnosis not present

## 2016-10-30 MED ORDER — LOSARTAN POTASSIUM 25 MG PO TABS: 25.0000 mg | ORAL_TABLET | Freq: Every day | ORAL | 3 refills | Status: DC

## 2016-10-30 NOTE — Progress Notes (Signed)
SUBJECTIVE: The patient presents for follow-up of chest pain, abnormal nuclear stress test, and mitral regurgitation.  Echocardiogram 07/22/16 demonstrated normal left ventricular systolic function and regional wall motion, LVEF 60-65%, mild mitral posterior leaflet prolapse with mild to moderate regurgitation.  Nuclear stress test however demonstrated a medium-sized defect of moderate severity in the basal inferoseptal, basal inferior, mid inferoseptal, mid inferior, mid inferolateral, and apical inferior regions which was fixed consistent with prior myocardial infarction, calculated LVEF 42%.  He is doing well and denies chest pain and palpitations. He said if he overdoes it has some shortness of breath. He tells me systolic blood pressure was in the 150 range at his PCPs office. It is 158/84 today. He bruises easily and takes both aspirin and Plavix.  He was evaluated in the ED for left leg pain earlier this month. 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   Review of Systems: As per "subjective", otherwise negative.  No Known Allergies  Current Outpatient Prescriptions  Medication Sig Dispense Refill  . acetaminophen (TYLENOL) 500 MG tablet Take 500 mg by mouth every 6 (six) hours as needed for pain.    Marland Kitchen aspirin EC 81 MG tablet Take 81 mg by mouth daily.    . Cholecalciferol (VITAMIN D3) 2000 units TABS Take 1 tablet by mouth daily.    . clopidogrel (PLAVIX) 75 MG tablet Take 75 mg by mouth daily.    Marland Kitchen DEXILANT 60 MG capsule Take 1 capsule by mouth daily.    . timolol (BETIMOL) 0.5 % ophthalmic solution Place 1 drop into both eyes daily.      No current facility-administered medications for this visit.     Past Medical History:  Diagnosis Date  . Cancer [facility])    prostate  . Complication of anesthesia    Pt had a difficult time swallowing after surgery.  lost 20 lbs after hernia surgery  . GERD (gastroesophageal reflux disease)   .  Glaucoma   . Hyperlipidemia   . Osteoarthritis of glenohumeral joint 12/15/2012  . TIA (transient ischemic attack)     Past Surgical History:  Procedure Laterality Date  . HERNIA REPAIR Right 2015  . INGUINAL HERNIA REPAIR Left 10/08/2014   Procedure: LEFT INGUINAL HERNIORRHAPHY;  Surgeon: Aviva Signs, MD;  Location: [facility];  Service: General;  Laterality: Left;  . INSERTION OF MESH Left 10/08/2014   Procedure: INSERTION OF MESH;  Surgeon: Aviva Signs, MD;  Location: [facility];  Service: General;  Laterality: Left;  . KNEE SURGERY    . PROSTATECTOMY      Social History   Social History  . Marital status: Married    Spouse name: N/A  . Number of children: N/A  . Years of education: N/A   Occupational History  . Not on file.   Social History Main Topics  . Smoking status: Never Smoker  . Smokeless tobacco: Never Used  . Alcohol use No  . Drug use: No  . Sexual activity: Not Currently   Other Topics Concern  . Not on file   Social History Narrative  . No narrative on file     Vitals:   10/30/16 1046  BP: (!) 158/84  Pulse: 65  SpO2: 97%  Weight: 167 lb 12.8 oz (76.1 kg)  Height: 5\' 8"  (1.727 m)    Wt Readings from Last 3 Encounters:  10/30/16 167 lb 12.8 oz (76.1 kg)  10/14/16 170 lb (77.1 kg)  07/20/16 172  lb (78 kg)     PHYSICAL EXAM General: NAD HEENT: Normal. Neck: No JVD, no thyromegaly. Lungs: Clear to auscultation bilaterally with normal respiratory effort. CV: Nondisplaced PMI.  Regular rate and rhythm, normal S1/S2, no S3/S4, 2/6 apical holosystolic murmur. No pretibial or periankle edema.  No carotid bruit.   Abdomen: Soft, nontender, no distention.  Neurologic: Alert and oriented.  Psych: Normal affect. Skin: Normal. Musculoskeletal: No gross deformities.    ECG: Most recent ECG reviewed.   Labs: Lab Results  Component Value Date/Time   K 4.2 07/14/2016 04:41 AM   BUN 18 07/14/2016 04:41 AM   CREATININE 1.15 07/14/2016 04:41 AM    ALT 19 07/14/2016 04:41 AM   TSH 1.116 12/14/2012 09:55 PM   HGB 15.3 07/14/2016 04:41 AM     Lipids: No results found for: LDLCALC, LDLDIRECT, CHOL, TRIG, HDL     ASSESSMENT AND PLAN:  1. Chest pain: No recurrences. LV systolic function is normal as detailed above. I will stop ASA. He is on Plavix for h/o TIA.  2. Abnormal nuclear stress test: Echocardiogram demonstrated normal left ventricular systolic function and regional wall motion as detailed above. Stress test showed evidence of prior myocardial infarction.  Continue Plavix for h/o TIA.  3. Mitral regurgitation: Mild to moderate with mild posterior leaflet prolapse. Clinically stable. Will monitor.  4. History of TIA: I will aim to control blood pressure with the addition of losartan 25 mg daily. I will stop aspirin and continue Plavix for the time being.  5. Hypertension:  I will aim to control blood pressure with the addition of losartan 25 mg daily.  6. Left leg pain: Symptoms are improving and are likely due to moderate DJD of the lumbar spine with radiculopathy. He received a steroid injection and feels much better. I have recommended physical therapy and pool exercises.    Disposition: Follow up 6 months.   Kate Sable, M.D., F.A.C.C.

## 2016-10-30 NOTE — Patient Instructions (Signed)
Medication Instructions:   Your physician has recommended you make the following change in your medication:   Stop aspirin.  Start losartan 25 mg by mouth daily.  Continue all other medications the same.  Labwork:  NONE  Testing/Procedures:  NONE  Follow-Up:  Your physician recommends that you schedule a follow-up appointment in: 6 months. You will receive a reminder letter in the mail in about 4 months reminding you to call and schedule your appointment. If you don't receive this letter, please contact our office.  Any Other Special Instructions Will Be Listed Below (If Applicable).  If you need a refill on your cardiac medications before your next appointment, please call your pharmacy.

## 2017-03-29 DIAGNOSIS — K219 Gastro-esophageal reflux disease without esophagitis: Secondary | ICD-10-CM | POA: Diagnosis not present

## 2017-03-29 DIAGNOSIS — Z7902 Long term (current) use of antithrombotics/antiplatelets: Secondary | ICD-10-CM | POA: Diagnosis not present

## 2017-03-29 DIAGNOSIS — B353 Tinea pedis: Secondary | ICD-10-CM | POA: Diagnosis not present

## 2017-03-29 DIAGNOSIS — I10 Essential (primary) hypertension: Secondary | ICD-10-CM | POA: Diagnosis not present

## 2017-03-29 DIAGNOSIS — Z6825 Body mass index (BMI) 25.0-25.9, adult: Secondary | ICD-10-CM | POA: Diagnosis not present

## 2017-03-29 DIAGNOSIS — H409 Unspecified glaucoma: Secondary | ICD-10-CM | POA: Diagnosis not present

## 2017-03-29 DIAGNOSIS — I38 Endocarditis, valve unspecified: Secondary | ICD-10-CM | POA: Diagnosis not present

## 2017-03-29 DIAGNOSIS — E663 Overweight: Secondary | ICD-10-CM | POA: Diagnosis not present

## 2017-03-29 DIAGNOSIS — E559 Vitamin D deficiency, unspecified: Secondary | ICD-10-CM | POA: Diagnosis not present

## 2017-03-29 DIAGNOSIS — M545 Low back pain: Secondary | ICD-10-CM | POA: Diagnosis not present

## 2017-05-19 DIAGNOSIS — H25813 Combined forms of age-related cataract, bilateral: Secondary | ICD-10-CM | POA: Diagnosis not present

## 2017-05-19 DIAGNOSIS — H401134 Primary open-angle glaucoma, bilateral, indeterminate stage: Secondary | ICD-10-CM | POA: Diagnosis not present

## 2017-06-17 ENCOUNTER — Ambulatory Visit: Payer: Medicare HMO | Admitting: Cardiovascular Disease

## 2017-06-17 ENCOUNTER — Encounter: Payer: Self-pay | Admitting: Cardiovascular Disease

## 2017-06-17 VITALS — BP 142/82 | HR 77 | Ht 68.0 in | Wt 161.0 lb

## 2017-06-17 DIAGNOSIS — M48061 Spinal stenosis, lumbar region without neurogenic claudication: Secondary | ICD-10-CM

## 2017-06-17 DIAGNOSIS — I340 Nonrheumatic mitral (valve) insufficiency: Secondary | ICD-10-CM | POA: Diagnosis not present

## 2017-06-17 DIAGNOSIS — R079 Chest pain, unspecified: Secondary | ICD-10-CM

## 2017-06-17 DIAGNOSIS — R9439 Abnormal result of other cardiovascular function study: Secondary | ICD-10-CM

## 2017-06-17 DIAGNOSIS — Z8673 Personal history of transient ischemic attack (TIA), and cerebral infarction without residual deficits: Secondary | ICD-10-CM | POA: Diagnosis not present

## 2017-06-17 DIAGNOSIS — I10 Essential (primary) hypertension: Secondary | ICD-10-CM | POA: Diagnosis not present

## 2017-06-17 MED ORDER — CLOPIDOGREL BISULFATE 75 MG PO TABS: 75.0000 mg | ORAL_TABLET | Freq: Every day | ORAL | 3 refills | Status: DC

## 2017-06-17 MED ORDER — ATORVASTATIN CALCIUM 20 MG PO TABS: 20.0000 mg | ORAL_TABLET | Freq: Every day | ORAL | 3 refills | Status: DC

## 2017-06-17 NOTE — Patient Instructions (Addendum)
Your physician wants you to follow-up in:1 year  with Dr.Koneswaran You will receive a reminder letter in the mail two months in advance. If you don't receive a letter, please call our office to schedule the follow-up appointment.    START Lipitor 20 mg daily at dinner    All other medications stay the same    If you need a refill on your cardiac medications before your next appointment, please call your pharmacy.     No lab work or tests ordered today.      Thank you for choosing [facility] !

## 2017-06-17 NOTE — Progress Notes (Signed)
SUBJECTIVE: The patient presents for follow-up of chest pain, abnormal nuclear stress test, and mitral regurgitation.  Echocardiogram 07/22/16 demonstrated normal left ventricular systolic function and regional wall motion, LVEF 60-65%, mild mitral posterior leaflet prolapse with mild to moderate regurgitation.  Nuclear stress test however demonstrated a medium-sized defect of moderate severity in the basal inferoseptal, basal inferior, mid inferoseptal, mid inferior, mid inferolateral, and apical inferior regions which was fixed consistent with prior myocardial infarction, calculated LVEF 42%.  The patient denies any symptoms of chest pain, palpitations, shortness of breath, lightheadedness, dizziness, leg swelling, orthopnea, PND, and syncope.  ECG performed in the office today which I ordered and personally interpreted demonstrates normal sinus rhythm with no ischemic ST segment or T-wave abnormalities, nor any arrhythmias.  His wife recently slipped and fell on her deck and broke her left leg.  Last night he experienced significant left leg cramps.    Review of Systems: As per "subjective", otherwise negative.  No Known Allergies  Current Outpatient Medications  Medication Sig Dispense Refill  . acetaminophen (TYLENOL) 500 MG tablet Take 500 mg by mouth every 6 (six) hours as needed for pain.    . Cholecalciferol (VITAMIN D3) 2000 units TABS Take 1 tablet by mouth daily.    . clopidogrel (PLAVIX) 75 MG tablet Take 75 mg by mouth daily.    Marland Kitchen losartan (COZAAR) 25 MG tablet Take 1 tablet (25 mg total) by mouth daily. 90 tablet 3  . pantoprazole (PROTONIX) 40 MG tablet Take 40 mg by mouth at bedtime.    . timolol (BETIMOL) 0.5 % ophthalmic solution Place 1 drop into both eyes daily.      No current facility-administered medications for this visit.     Past Medical History:  Diagnosis Date  . Cancer [facility])    prostate  . Complication of anesthesia    Pt had a difficult  time swallowing after surgery.  lost 20 lbs after hernia surgery  . GERD (gastroesophageal reflux disease)   . Glaucoma   . Hyperlipidemia   . Osteoarthritis of glenohumeral joint 12/15/2012  . TIA (transient ischemic attack)     Past Surgical History:  Procedure Laterality Date  . HERNIA REPAIR Right 2015  . INGUINAL HERNIA REPAIR Left 10/08/2014   Procedure: LEFT INGUINAL HERNIORRHAPHY;  Surgeon: Aviva Signs, MD;  Location: [facility];  Service: General;  Laterality: Left;  . INSERTION OF MESH Left 10/08/2014   Procedure: INSERTION OF MESH;  Surgeon: Aviva Signs, MD;  Location: [facility];  Service: General;  Laterality: Left;  . KNEE SURGERY    . PROSTATECTOMY      Social History   Socioeconomic History  . Marital status: Married    Spouse name: Not on file  . Number of children: Not on file  . Years of education: Not on file  . Highest education level: Not on file  Occupational History  . Not on file  Social Needs  . Financial resource strain: Not on file  . Food insecurity:    Worry: Not on file    Inability: Not on file  . Transportation needs:    Medical: Not on file    Non-medical: Not on file  Tobacco Use  . Smoking status: Never Smoker  . Smokeless tobacco: Never Used  Substance and Sexual Activity  . Alcohol use: No  . Drug use: No  . Sexual activity: Not Currently  Lifestyle  . Physical activity:    Days per week: Not  on file    Minutes per session: Not on file  . Stress: Not on file  Relationships  . Social connections:    Talks on phone: Not on file    Gets together: Not on file    Attends religious service: Not on file    Active member of club or organization: Not on file    Attends meetings of clubs or organizations: Not on file    Relationship status: Not on file  . Intimate partner violence:    Fear of current or ex partner: Not on file    Emotionally abused: Not on file    Physically abused: Not on file    Forced sexual activity: Not on file    Other Topics Concern  . Not on file  Social History Narrative  . Not on file     Vitals:   06/17/17 1041  BP: (!) 142/82  Pulse: 77  SpO2: 97%  Weight: 161 lb (73 kg)  Height: 5\' 8"  (1.727 m)    Wt Readings from Last 3 Encounters:  06/17/17 161 lb (73 kg)  10/30/16 167 lb 12.8 oz (76.1 kg)  10/14/16 170 lb (77.1 kg)     PHYSICAL EXAM General: NAD HEENT: Normal. Neck: No JVD, no thyromegaly. Lungs: Clear to auscultation bilaterally with normal respiratory effort. CV: Regular rate and rhythm, normal S1/S2, no S3/S4, 2/6 apical holosystolicmurmur. No pretibial or periankle edema.  No carotid bruit.   Abdomen: Soft, nontender, no distention.  Neurologic: Alert and oriented.  Psych: Normal affect. Skin: Normal. Musculoskeletal: No gross deformities.    ECG: Most recent ECG reviewed.   Labs: Lab Results  Component Value Date/Time   K 4.2 07/14/2016 04:41 AM   BUN 18 07/14/2016 04:41 AM   CREATININE 1.15 07/14/2016 04:41 AM   ALT 19 07/14/2016 04:41 AM   TSH 1.116 12/14/2012 09:55 PM   HGB 15.3 07/14/2016 04:41 AM     Lipids: No results found for: LDLCALC, LDLDIRECT, CHOL, TRIG, HDL     ASSESSMENT AND PLAN: 1. Chest pain: No recurrences. LV systolic function is normal as detailed above. He is on Plavix for h/o TIA.  2. Abnormal nuclear stress test with presumed CAD: Echocardiogram demonstrated normal left ventricular systolic function and regional wall motion as detailed above. Stress test showed evidence of prior myocardial infarction.  Continue Plavix for h/o TIA.  I will add Lipitor 20 mg daily to see if he tolerates this medication for its pleiotropic effects.  3. Mitral regurgitation: Mild to moderate with mild posterior leaflet prolapse. Clinically stable. Will monitor.  4. History of TIA: He is on Plavix.   I will add Lipitor 20 mg daily to see if he tolerates this medication for its pleiotropic effects.  5. Hypertension: Blood pressure is  mildly elevated.  This will need further monitoring.  6. Left leg pain and cramps/lumbar spinal stenosis: Symptoms are alleviated with bending forward.  This is indicative of lumbar spinal stenosis.  I recommended he speak with his PCP about potentially seeing a back specialist.    Disposition: Follow up 1 year   Kate Sable, M.D., F.A.C.C.

## 2017-07-01 DIAGNOSIS — R030 Elevated blood-pressure reading, without diagnosis of hypertension: Secondary | ICD-10-CM | POA: Diagnosis not present

## 2017-07-01 DIAGNOSIS — Z1389 Encounter for screening for other disorder: Secondary | ICD-10-CM | POA: Diagnosis not present

## 2017-07-01 DIAGNOSIS — M545 Low back pain: Secondary | ICD-10-CM | POA: Diagnosis not present

## 2017-07-01 DIAGNOSIS — R69 Illness, unspecified: Secondary | ICD-10-CM | POA: Diagnosis not present

## 2017-07-01 DIAGNOSIS — R5383 Other fatigue: Secondary | ICD-10-CM | POA: Diagnosis not present

## 2017-07-01 DIAGNOSIS — I679 Cerebrovascular disease, unspecified: Secondary | ICD-10-CM | POA: Diagnosis not present

## 2017-08-30 DIAGNOSIS — H61899 Other specified disorders of external ear, unspecified ear: Secondary | ICD-10-CM | POA: Diagnosis not present

## 2017-09-01 DIAGNOSIS — H401134 Primary open-angle glaucoma, bilateral, indeterminate stage: Secondary | ICD-10-CM | POA: Diagnosis not present

## 2017-09-01 DIAGNOSIS — H25813 Combined forms of age-related cataract, bilateral: Secondary | ICD-10-CM | POA: Diagnosis not present

## 2017-09-04 ENCOUNTER — Other Ambulatory Visit: Payer: Self-pay | Admitting: Cardiovascular Disease

## 2017-10-13 DIAGNOSIS — L89319 Pressure ulcer of right buttock, unspecified stage: Secondary | ICD-10-CM | POA: Diagnosis not present

## 2017-10-13 DIAGNOSIS — S50812A Abrasion of left forearm, initial encounter: Secondary | ICD-10-CM | POA: Diagnosis not present

## 2017-10-13 DIAGNOSIS — L821 Other seborrheic keratosis: Secondary | ICD-10-CM | POA: Diagnosis not present

## 2017-10-13 DIAGNOSIS — L89159 Pressure ulcer of sacral region, unspecified stage: Secondary | ICD-10-CM | POA: Diagnosis not present

## 2017-10-13 DIAGNOSIS — L820 Inflamed seborrheic keratosis: Secondary | ICD-10-CM | POA: Diagnosis not present

## 2017-10-13 DIAGNOSIS — Z789 Other specified health status: Secondary | ICD-10-CM | POA: Diagnosis not present

## 2017-10-13 DIAGNOSIS — Z1283 Encounter for screening for malignant neoplasm of skin: Secondary | ICD-10-CM | POA: Diagnosis not present

## 2017-10-13 DIAGNOSIS — L218 Other seborrheic dermatitis: Secondary | ICD-10-CM | POA: Diagnosis not present

## 2017-10-13 DIAGNOSIS — D1801 Hemangioma of skin and subcutaneous tissue: Secondary | ICD-10-CM | POA: Diagnosis not present

## 2017-11-02 DIAGNOSIS — Z6824 Body mass index (BMI) 24.0-24.9, adult: Secondary | ICD-10-CM | POA: Diagnosis not present

## 2017-11-02 DIAGNOSIS — R252 Cramp and spasm: Secondary | ICD-10-CM | POA: Diagnosis not present

## 2017-11-02 DIAGNOSIS — R21 Rash and other nonspecific skin eruption: Secondary | ICD-10-CM | POA: Diagnosis not present

## 2017-11-02 DIAGNOSIS — I10 Essential (primary) hypertension: Secondary | ICD-10-CM | POA: Diagnosis not present

## 2018-01-28 IMAGING — DX DG LUMBAR SPINE COMPLETE 4+V
5 series · 5 of 5 positions shown · non-contrast
Comparison: No previous studies available for review.

CLINICAL DATA: Less post fall several days ago with persistent low
back pain radiating into the left groin.

EXAM:
LUMBAR SPINE - COMPLETE 4+ VIEW

[l-spine ap]
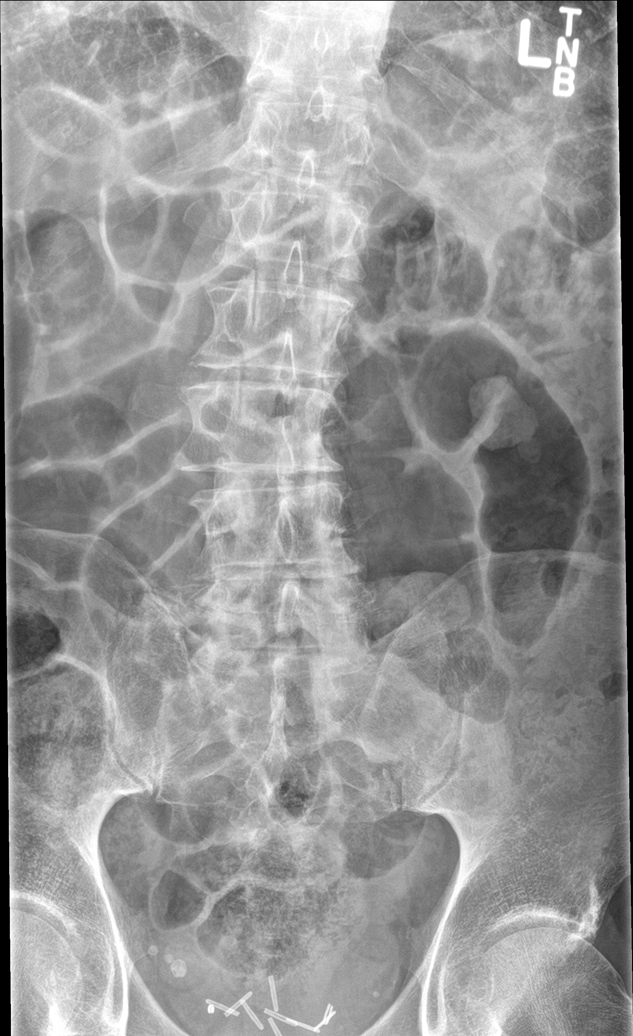

[l-spine obl (1 of 2)]
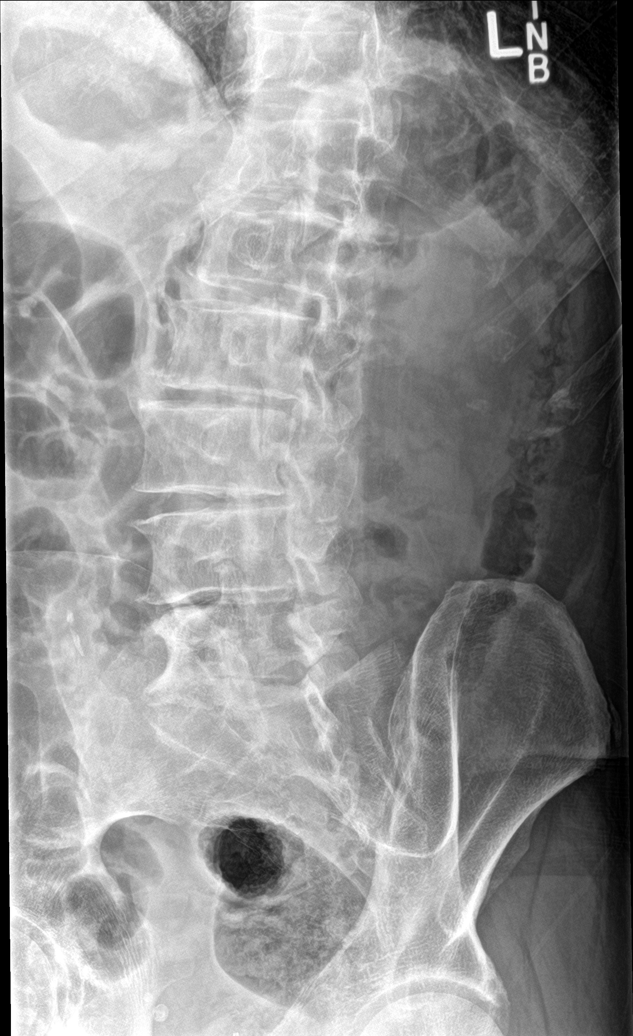

[l-spine obl (2 of 2)]
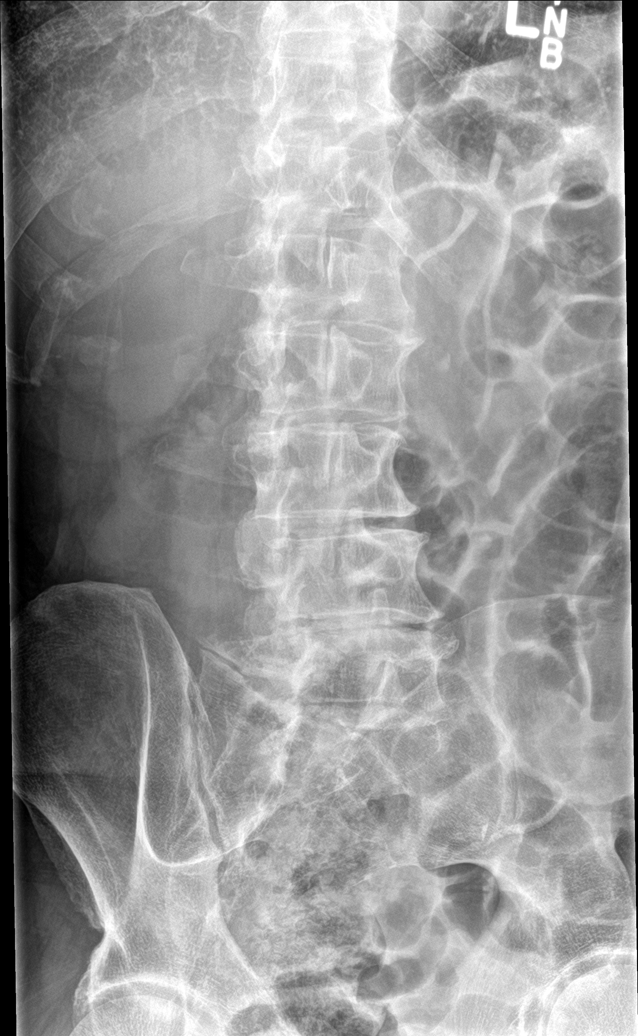

[l-spine lat]
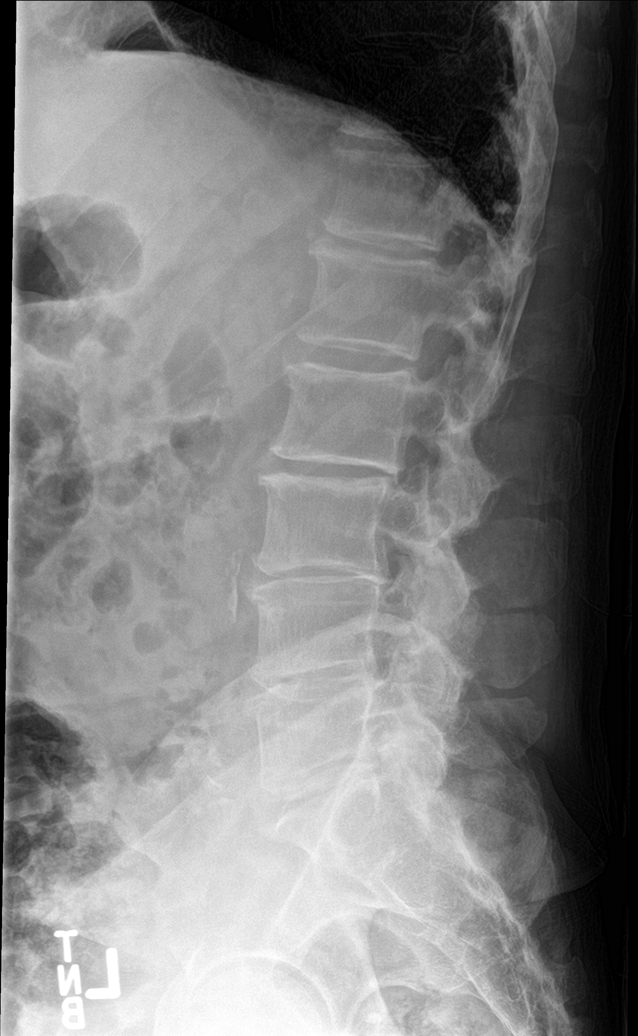

[l-spine spot]
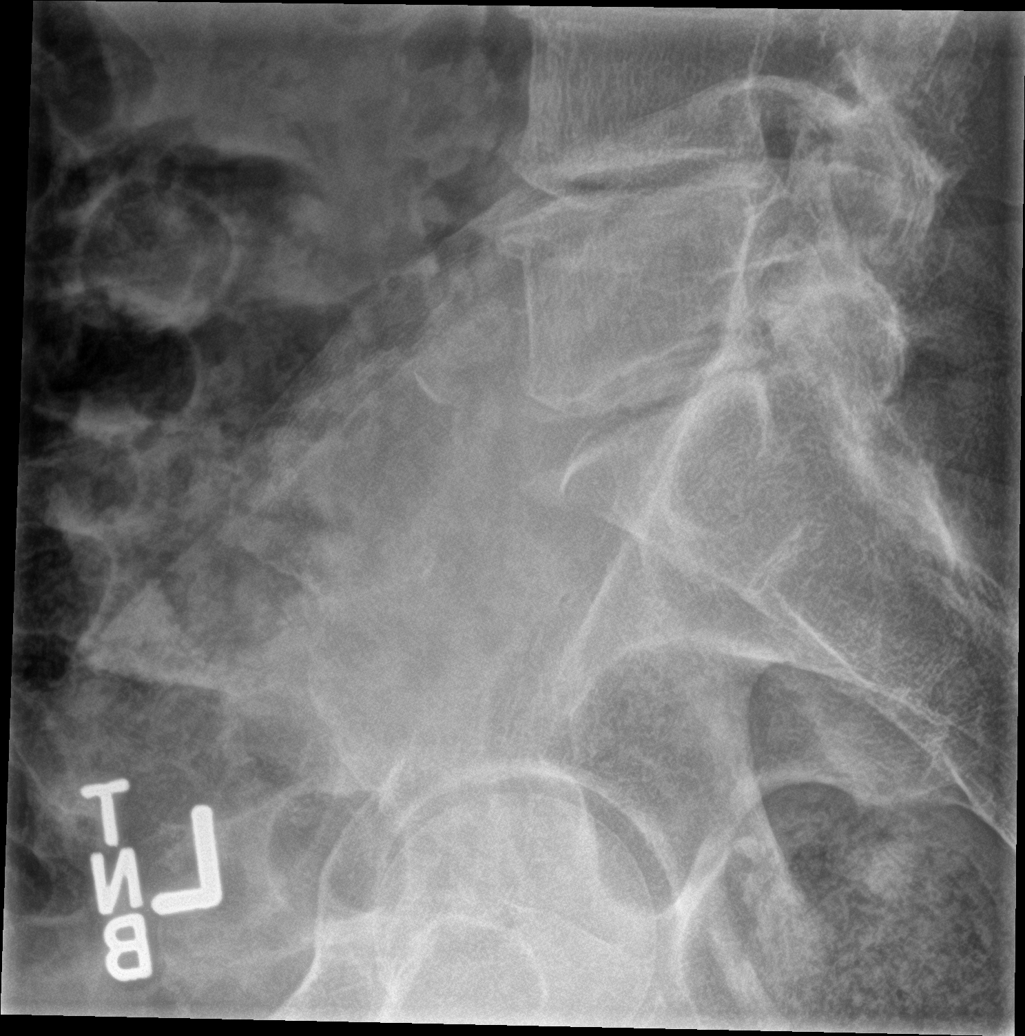

[5 of 5 positions shown; findings below may reference images not displayed]

FINDINGS: There is mild curvature centered in the mid lumbar spine convex
toward the right. The lumbar vertebral bodies are preserved in
height. The pedicles and transverse processes are grossly intact.
There is no spondylolisthesis. There is multilevel moderate
degenerative disc space narrowing with relative sparing of L1-2.
There is mild facet joint hypertrophy at L5-S1. The observed
portions of the sacrum are normal. There is calcification in the
wall of the abdominal aorta.
IMPRESSION: No acute compression fracture or spondylolisthesis is observed.
There is multilevel moderate degenerative disc space narrowing
consistent with osteoarthritis. Facet joint hypertrophy at L5-S1 is
also consistent with osteoarthritis.

## 2018-04-24 ENCOUNTER — Other Ambulatory Visit: Payer: Self-pay | Admitting: Cardiovascular Disease

## 2018-06-13 ENCOUNTER — Telehealth: Payer: Self-pay

## 2018-06-13 NOTE — Telephone Encounter (Signed)
Called pt. To offer to rs his appt.He would rather keep his appointment over the phone. Discussed his medications, made changes. Pharmacy is updated also.      Virtual Visit Pre-Appointment Phone Call  Steps For Call:  1. Confirm consent - "In the setting of the current Covid19 crisis, you are scheduled for a (phone or video) visit with your provider on (date) at (time).  Just as we do with many in-office visits, in order for you to participate in this visit, we must obtain consent.  If you'd like, I can send this to your mychart (if signed up) or email for you to review.  Otherwise, I can obtain your verbal consent now.  All virtual visits are billed to your insurance company just like a normal visit would be.  By agreeing to a virtual visit, we'd like you to understand that the technology does not allow for your provider to perform an examination, and thus may limit your provider's ability to fully assess your condition.  Finally, though the technology is pretty good, we cannot assure that it will always work on either your or our end, and in the setting of a video visit, we may have to convert it to a phone-only visit.  In either situation, we cannot ensure that we have a secure connection.  Are you willing to proceed?"  2. Give patient instructions for WebEx download to smartphone as below if video visit  3. Advise patient to be prepared with any vital sign or heart rhythm information, their current medicines, and a piece of paper and pen handy for any instructions they may receive the day of their visit  4. Inform patient they will receive a phone call 15 minutes prior to their appointment time (may be from unknown caller ID) so they should be prepared to answer  5. Confirm that appointment type is correct in Epic appointment notes (video vs telephone)    TELEPHONE CALL NOTE  Roger Sutton has been deemed a candidate for a follow-up tele-health visit to limit community exposure  during the Covid-19 pandemic. I spoke with the patient via phone to ensure availability of phone/video source, confirm preferred email & phone number, and discuss instructions and expectations.  I reminded Roger Sutton to be prepared with any vital sign and/or heart rhythm information that could potentially be obtained via home monitoring, at the time of his visit. I reminded Roger Sutton to expect a phone call at the time of his visit if his visit.  Did the patient verbally acknowledge consent to treatment? Yes   Drema Dallas, [place] 06/13/2018 9:15 AM   DOWNLOADING THE [place] TO SMARTPHONE  - If Apple, go to [employer] and type in WebEx in the search bar. [place] [employer], the blue/green circle. The app is free but as with any other app downloads, their phone may require them to verify saved payment information or Apple password. The patient does NOT have to create an account.  - If Android, ask patient to go to [employer] and type in WebEx in the search bar. [place] [employer], the blue/green circle. The app is free but as with any other app downloads, their phone may require them to verify saved payment information or Android password. The patient does NOT have to create an account.   CONSENT FOR TELE-HEALTH VISIT - PLEASE REVIEW  I hereby voluntarily request, consent and authorize [facility] and its employed or contracted physicians,  physician assistants, nurse practitioners or other licensed health care professionals (the Practitioner), to provide me with telemedicine health care services (the "Services") as deemed necessary by the treating Practitioner. I acknowledge and consent to receive the Services by the Practitioner via telemedicine. I understand that the telemedicine visit will involve communicating with the Practitioner through live audiovisual communication technology and the disclosure of certain medical information by electronic  transmission. I acknowledge that I have been given the opportunity to request an in-person assessment or other available alternative prior to the telemedicine visit and am voluntarily participating in the telemedicine visit.  I understand that I have the right to withhold or withdraw my consent to the use of telemedicine in the course of my care at any time, without affecting my right to future care or treatment, and that the Practitioner or I may terminate the telemedicine visit at any time. I understand that I have the right to inspect all information obtained and/or recorded in the course of the telemedicine visit and may receive copies of available information for a reasonable fee.  I understand that some of the potential risks of receiving the Services via telemedicine include:  Marland Kitchen Delay or interruption in medical evaluation due to technological equipment failure or disruption; . Information transmitted may not be sufficient (e.g. poor resolution of images) to allow for appropriate medical decision making by the Practitioner; and/or  . In rare instances, security protocols could fail, causing a breach of personal health information.  Furthermore, I acknowledge that it is my responsibility to provide information about my medical history, conditions and care that is complete and accurate to the best of my ability. I acknowledge that Practitioner's advice, recommendations, and/or decision may be based on factors not within their control, such as incomplete or inaccurate data provided by me or distortions of diagnostic images or specimens that may result from electronic transmissions. I understand that the practice of medicine is not an exact science and that Practitioner makes no warranties or guarantees regarding treatment outcomes. I acknowledge that I will receive a copy of this consent concurrently upon execution via email to the email address I last provided but may also request a printed copy by  calling the office of [facility].    I understand that my insurance will be billed for this visit.   I have read or had this consent read to me. . I understand the contents of this consent, which adequately explains the benefits and risks of the Services being provided via telemedicine.  . I have been provided ample opportunity to ask questions regarding this consent and the Services and have had my questions answered to my satisfaction. . I give my informed consent for the services to be provided through the use of telemedicine in my medical care  By participating in this telemedicine visit I agree to the above.

## 2018-06-17 ENCOUNTER — Telehealth: Payer: Self-pay | Admitting: *Deleted

## 2018-06-17 NOTE — Telephone Encounter (Signed)
Called to review meds and pharmacy. Pt able to check BP at home

## 2018-06-20 ENCOUNTER — Telehealth (INDEPENDENT_AMBULATORY_CARE_PROVIDER_SITE_OTHER): Payer: Medicare HMO | Admitting: Cardiovascular Disease

## 2018-06-20 ENCOUNTER — Encounter: Payer: Self-pay | Admitting: Cardiovascular Disease

## 2018-06-20 VITALS — BP 140/76 | HR 74 | Ht 68.0 in | Wt 169.0 lb

## 2018-06-20 DIAGNOSIS — I340 Nonrheumatic mitral (valve) insufficiency: Secondary | ICD-10-CM

## 2018-06-20 DIAGNOSIS — Z8673 Personal history of transient ischemic attack (TIA), and cerebral infarction without residual deficits: Secondary | ICD-10-CM

## 2018-06-20 DIAGNOSIS — I10 Essential (primary) hypertension: Secondary | ICD-10-CM

## 2018-06-20 DIAGNOSIS — R9439 Abnormal result of other cardiovascular function study: Secondary | ICD-10-CM

## 2018-06-20 NOTE — Patient Instructions (Signed)

## 2018-06-20 NOTE — Progress Notes (Signed)
Virtual Visit via Telephone Note   This visit type was conducted due to national recommendations for restrictions regarding the COVID-19 Pandemic (e.g. social distancing) in an effort to limit this patient's exposure and mitigate transmission in our community.  Due to his co-morbid illnesses, this patient is at least at moderate risk for complications without adequate follow up.  This format is felt to be most appropriate for this patient at this time.  The patient did not have access to video technology/had technical difficulties with video requiring transitioning to audio format only (telephone).  All issues noted in this document were discussed and addressed.  No physical exam could be performed with this format.  Please refer to the patient's chart for his  consent to telehealth for [facility].   Evaluation Performed:  Follow-up visit  Date:  06/20/2018   ID:  Roger Sutton, DOB [birth year], MRN [id number]  Patient Location: Home Provider Location: Office  PCP:  Andres Shad, MD  Cardiologist:  Kate Sable, MD  Electrophysiologist:  None   Chief Complaint: Presumed coronary artery disease  History of Present Illness:    Roger Sutton is a 83 y.o. male with a history of an abnormal nuclear stress test and presumed coronary artery disease.  He also has mitral regurgitation and a history of TIA.  The patient denies any symptoms of chest pain, palpitations, shortness of breath, lightheadedness, dizziness, leg swelling, orthopnea, PND, and syncope.  He does some yardwork when the weather is nice.   He does have some left leg pain as he did at last visit starting in the buttock region.  He had several questions regarding his medications and physical activity. He eats a lot of fruit and occasionally vegetables.  The patient does not have symptoms concerning for COVID-19 infection (fever, chills, cough, or new shortness of breath).    Past Medical  History:  Diagnosis Date  . Cancer [facility])    prostate  . Complication of anesthesia    Pt had a difficult time swallowing after surgery.  lost 20 lbs after hernia surgery  . GERD (gastroesophageal reflux disease)   . Glaucoma   . Hyperlipidemia   . Osteoarthritis of glenohumeral joint 12/15/2012  . TIA (transient ischemic attack)    Past Surgical History:  Procedure Laterality Date  . HERNIA REPAIR Right 2015  . INGUINAL HERNIA REPAIR Left 10/08/2014   Procedure: LEFT INGUINAL HERNIORRHAPHY;  Surgeon: Aviva Signs, MD;  Location: [facility];  Service: General;  Laterality: Left;  . INSERTION OF MESH Left 10/08/2014   Procedure: INSERTION OF MESH;  Surgeon: Aviva Signs, MD;  Location: [facility];  Service: General;  Laterality: Left;  . KNEE SURGERY    . PROSTATECTOMY       Current Meds  Medication Sig  . acetaminophen (TYLENOL) 500 MG tablet Take 500 mg by mouth every 6 (six) hours as needed for pain.  Marland Kitchen atorvastatin (LIPITOR) 20 MG tablet TAKE 1 TABLET BY MOUTH  DAILY  . Cholecalciferol (VITAMIN D3) 2000 units TABS Take 1 tablet by mouth daily.  . clopidogrel (PLAVIX) 75 MG tablet TAKE 1 TABLET BY MOUTH  DAILY  . losartan (COZAAR) 25 MG tablet TAKE 1 TABLET BY MOUTH  DAILY  . pantoprazole (PROTONIX) 40 MG tablet Take 40 mg by mouth at bedtime.  . timolol (BETIMOL) 0.5 % ophthalmic solution Place 1 drop into both eyes daily.      Allergies:   Patient has no known allergies.   Social  History   Tobacco Use  . Smoking status: Never Smoker  . Smokeless tobacco: Never Used  Substance Use Topics  . Alcohol use: No  . Drug use: No     Family Hx: The patient's family history includes Breast cancer in his sister; Diabetes in his sister; Heart attack in his father.  ROS:   Please see the history of present illness.     All other systems reviewed and are negative.   Prior CV studies:   The following studies were reviewed today:  Echocardiogram 07/22/16 demonstrated normal left  ventricular systolic function and regional wall motion, LVEF 60-65%, mild mitral posterior leaflet prolapse with mild to moderate regurgitation.  Nuclear stress test however demonstrated a medium-sized defect of moderate severity in the basal inferoseptal, basal inferior, mid inferoseptal, mid inferior, mid inferolateral, and apical inferior regions which was fixed consistent with prior myocardial infarction, calculated LVEF 42%.  Labs/Other Tests and Data Reviewed:    EKG:  No ECG reviewed.  Recent Labs: No results found for requested labs within last 8760 hours.   Recent Lipid Panel No results found for: CHOL, TRIG, HDL, CHOLHDL, LDLCALC, LDLDIRECT  Wt Readings from Last 3 Encounters:  06/20/18 169 lb (76.7 kg)  06/17/17 161 lb (73 kg)  10/30/16 167 lb 12.8 oz (76.1 kg)     Objective:    Vital Signs:  BP 140/76   Pulse 74   Ht 5\' 8"  (1.727 m)   Wt 169 lb (76.7 kg)   BMI 25.70 kg/m    VITAL SIGNS:  reviewed  ASSESSMENT & PLAN:    1. Chest pain: No recurrences.LV systolic function is normal as detailed above.He is on Plavix for h/o TIA and atorvastatin.  2. Abnormal nuclear stress test with presumed CAD: Echocardiogram demonstrated normal left ventricular systolic function and regional wall motionas detailed above.Stress testshowed evidence of prior myocardial infarction.  Continue Plavix for h/o TIA and Lipitor 20 mg daily for its pleiotropic effects. I gave him dietary counseling as well regarding a heart-healthy diet.  3. Mitral regurgitation: Mild to moderatewith mild posterior leaflet prolapse. Clinically stable. Will monitor.  4. History of TIA: He is on Plavix and Lipitor 20 mg daily for its pleiotropic effects.  5.Hypertension: Blood pressure is mildly elevated.  This will need further monitoring.  6. Left leg pain and cramps/lumbar spinal stenosis: Symptoms are alleviated with bending forward.  This is indicative of lumbar spinal stenosis.  I again  recommended he speak with his PCP about potentially seeing a back specialist.   COVID-19 Education: The signs and symptoms of COVID-19 were discussed with the patient and how to seek care for testing (follow up with PCP or arrange E-visit).  The importance of social distancing was discussed today.  Time:   Today, I have spent 15 minutes with the patient with telehealth technology discussing the above problems.     Medication Adjustments/Labs and Tests Ordered: Current medicines are reviewed at length with the patient today.  Concerns regarding medicines are outlined above.   Tests Ordered: No orders of the defined types were placed in this encounter.   Medication Changes: No orders of the defined types were placed in this encounter.   Disposition:  Follow up in 1 year(s)  Signed, Kate Sable, MD  06/20/2018 2:07 PM    [place]

## 2018-07-07 ENCOUNTER — Other Ambulatory Visit: Payer: Self-pay | Admitting: Cardiovascular Disease

## 2018-12-19 ENCOUNTER — Telehealth (INDEPENDENT_AMBULATORY_CARE_PROVIDER_SITE_OTHER): Payer: Medicare HMO | Admitting: Cardiovascular Disease

## 2018-12-19 ENCOUNTER — Encounter: Payer: Self-pay | Admitting: Cardiovascular Disease

## 2018-12-19 VITALS — BP 128/81 | HR 65 | Temp 97.5°F | Ht 68.0 in | Wt 165.0 lb

## 2018-12-19 DIAGNOSIS — R9439 Abnormal result of other cardiovascular function study: Secondary | ICD-10-CM

## 2018-12-19 DIAGNOSIS — Z8673 Personal history of transient ischemic attack (TIA), and cerebral infarction without residual deficits: Secondary | ICD-10-CM

## 2018-12-19 DIAGNOSIS — I340 Nonrheumatic mitral (valve) insufficiency: Secondary | ICD-10-CM

## 2018-12-19 DIAGNOSIS — I10 Essential (primary) hypertension: Secondary | ICD-10-CM

## 2018-12-19 NOTE — Patient Instructions (Addendum)
Medication Instructions:   Your physician recommends that you continue on your current medications as directed. Please refer to the Current Medication list given to you today. *If you need a refill on your cardiac medications before your next appointment, please call your pharmacy*  Lab Work:  NONE  Testing/Procedures:  NONE  Follow-Up: At [facility], you and your health needs are our priority.  As part of our continuing mission to provide you with exceptional heart care, we have created designated Provider Care Teams.  These Care Teams include your primary Cardiologist (physician) and Advanced Practice Providers (APPs -  Physician Assistants and Nurse Practitioners) who all work together to provide you with the care you need, when you need it.  Your next appointment:    Your physician recommends that you schedule a follow-up appointment in: 1 year. You will receive a reminder letter in the mail in about 10 months reminding you to call and schedule your appointment. If you don't receive this letter, please contact our office.  The format for your next appointment:    In Person  Provider:    Kate Sable, MD or Katina Dung, NP  Other Instructions NONE

## 2018-12-19 NOTE — Progress Notes (Signed)
Virtual Visit via Telephone Note   This visit type was conducted due to national recommendations for restrictions regarding the COVID-19 Pandemic (e.g. social distancing) in an effort to limit this patient's exposure and mitigate transmission in our community.  Due to his co-morbid illnesses, this patient is at least at moderate risk for complications without adequate follow up.  This format is felt to be most appropriate for this patient at this time.  The patient did not have access to video technology/had technical difficulties with video requiring transitioning to audio format only (telephone).  All issues noted in this document were discussed and addressed.  No physical exam could be performed with this format.  Please refer to the patient's chart for his  consent to telehealth for [facility]   Date:  12/19/2018   ID:  Roger Sutton, DOB October 05, 1935, MRN [id number]  Patient Location: Home Provider Location: Office  PCP:  Andres Shad, MD  Cardiologist:  Kate Sable, MD  Electrophysiologist:  None   Evaluation Performed:  Follow-Up Visit  Chief Complaint:  Presumed coronary artery disease  History of Present Illness:    Roger Sutton is a 83 y.o. male with a history of an abnormal nuclear stress test and presumed coronary artery disease.  He also has mitral regurgitation and a history of TIA.  He denies chest pain, palpitations, and shortness of breath.  Some days he feels energetic and some days he feels fatigued.  He has occasional headaches.  He denies leg swelling, orthopnea, and syncope.   Past Medical History:  Diagnosis Date  . Cancer [facility])    prostate  . Complication of anesthesia    Pt had a difficult time swallowing after surgery.  lost 20 lbs after hernia surgery  . GERD (gastroesophageal reflux disease)   . Glaucoma   . Hyperlipidemia   . Osteoarthritis of glenohumeral joint 12/15/2012  . TIA (transient ischemic attack)    Past  Surgical History:  Procedure Laterality Date  . HERNIA REPAIR Right 2015  . INGUINAL HERNIA REPAIR Left 10/08/2014   Procedure: LEFT INGUINAL HERNIORRHAPHY;  Surgeon: Aviva Signs, MD;  Location: [facility];  Service: General;  Laterality: Left;  . INSERTION OF MESH Left 10/08/2014   Procedure: INSERTION OF MESH;  Surgeon: Aviva Signs, MD;  Location: [facility];  Service: General;  Laterality: Left;  . KNEE SURGERY    . PROSTATECTOMY       Current Meds  Medication Sig  . acetaminophen (TYLENOL) 500 MG tablet Take 500 mg by mouth every 6 (six) hours as needed for pain.  Marland Kitchen atorvastatin (LIPITOR) 20 MG tablet TAKE 1 TABLET BY MOUTH  DAILY  . Cholecalciferol (VITAMIN D3) 2000 units TABS Take 1 tablet by mouth daily.  . clopidogrel (PLAVIX) 75 MG tablet TAKE 1 TABLET BY MOUTH  DAILY  . losartan (COZAAR) 25 MG tablet TAKE 1 TABLET BY MOUTH  DAILY  . pantoprazole (PROTONIX) 40 MG tablet Take 40 mg by mouth at bedtime.  . timolol (BETIMOL) 0.5 % ophthalmic solution Place 1 drop into both eyes daily.      Allergies:   Patient has no known allergies.   Social History   Tobacco Use  . Smoking status: Never Smoker  . Smokeless tobacco: Never Used  Substance Use Topics  . Alcohol use: No  . Drug use: No     Family Hx: The patient's family history includes Breast cancer in his sister; Diabetes in his sister; Heart attack in his  father.  ROS:   Please see the history of present illness.     All other systems reviewed and are negative.   Prior CV studies:   The following studies were reviewed today:  Echocardiogram 07/22/16 demonstrated normal left ventricular systolic function and regional wall motion, LVEF 60-65%, mild mitral posterior leaflet prolapse with mild to moderate regurgitation.  Nuclear stress test however demonstrated a medium-sized defect of moderate severity in the basal inferoseptal, basal inferior, mid inferoseptal, mid inferior, mid inferolateral, and apical inferior regions  which was fixed consistent with prior myocardial infarction, calculated LVEF 42%.  Labs/Other Tests and Data Reviewed:    EKG:  No ECG reviewed.  Recent Labs: No results found for requested labs within last 8760 hours.   Recent Lipid Panel No results found for: CHOL, TRIG, HDL, CHOLHDL, LDLCALC, LDLDIRECT  Wt Readings from Last 3 Encounters:  12/19/18 165 lb (74.8 kg)  06/20/18 169 lb (76.7 kg)  06/17/17 161 lb (73 kg)     Objective:    Vital Signs:  BP 128/81   Pulse 65   Temp (!) 97.5 F (36.4 C)   Ht 5\' 8"  (1.727 m)   Wt 165 lb (74.8 kg)   BMI 25.09 kg/m    VITAL SIGNS:  reviewed  ASSESSMENT & PLAN:    1. Chest pain: No recurrences.LV systolic function is normal as detailed above.He is on Plavix for h/o TIA and atorvastatin.  2. Abnormal nuclear stress testwith presumed CAD: Echocardiogram demonstrated normal left ventricular systolic function and regional wall motionas detailed above.Stress testshowed evidence of prior myocardial infarction.  Continue Plavix for h/o TIA and Lipitor 20 mg daily forits pleiotropiceffects. I previously gave him dietary counseling as well regarding a heart-healthy diet.  3. Mitral regurgitation: Mild to moderatewith mild posterior leaflet prolapse. Clinically stable. Will monitor.  4. History of TIA: He is on Plavix and Lipitor 20 mg daily forits pleiotropiceffects.  5.Hypertension:Blood pressure is normal.  No changes to therapy.   COVID-19 Education: The signs and symptoms of COVID-19 were discussed with the patient and how to seek care for testing (follow up with PCP or arrange E-visit).  The importance of social distancing was discussed today.  Time:   Today, I have spent 5 minutes with the patient with telehealth technology discussing the above problems.     Medication Adjustments/Labs and Tests Ordered: Current medicines are reviewed at length with the patient today.  Concerns regarding medicines are  outlined above.   Tests Ordered: No orders of the defined types were placed in this encounter.   Medication Changes: No orders of the defined types were placed in this encounter.   Follow Up:  In Person in 1 year(s)  Signed, Kate Sable, MD  12/19/2018 1:17 PM    [place]

## 2019-03-22 ENCOUNTER — Other Ambulatory Visit: Payer: Self-pay | Admitting: Cardiovascular Disease

## 2019-05-28 ENCOUNTER — Other Ambulatory Visit: Payer: Self-pay | Admitting: Cardiovascular Disease

## 2019-12-21 NOTE — Progress Notes (Signed)
Cardiology Office Note  Date: 12/22/2019   ID: Roger Sutton, DOB 1935/06/11, MRN [id number]  PCP:  Andres Shad, MD  Cardiologist:  Kate Sable, MD (Inactive) Electrophysiologist:  None   Chief Complaint: follow up Presumed CAD  History of Present Illness: Roger Sutton is a 84 y.o. male with a history of abnormal stress test, presumed CAD, MR, TIA, GERD, Prostate CA, HLD.  Last encounter with Dr. Bronson Ing 12/19/2018 via telemedicine.  Patient had experienced no recurrences of chest pain.  He was on Plavix for history of TIA and atorvastatin.  Prior stress test showed evidence of prior myocardial infarction.  He was to continue Plavix for history of TIA and Lipitor 20 mg for its daily.  Mild to moderate MR was clinically stable.  Blood pressure was normal.  No changes in therapy.  Patient is here today for 1 year follow-up.  He denies any recent acute illnesses, hospitalizations, surgeries in the interim since last visit.  States he has had both Covid vaccines as well as recent flu vaccine.  He denies any recent anginal symptoms, DOE or shortness of breath, lightheadedness, dizziness, presyncopal or syncopal episodes.  States that sometimes when he goes from a sitting to standing position he feels a little disequilibrium but is very short-lived.  States sometimes he gets post mild frontal headache when out in the heat in the summer months.  Admits to occasional cramping in hands and feet.  He denies any PND, orthopnea, claudication-like symptoms, DVT or PE-like symptoms, or lower extremity edema.  He states he saw his PCP not too long ago and all of his lab work was normal.  He sees Dr. Ledell Peoples, MD in [place].  Past Medical History:  Diagnosis Date  . Cancer [facility])    prostate  . Complication of anesthesia    Pt had a difficult time swallowing after surgery.  lost 20 lbs after hernia surgery  . GERD (gastroesophageal reflux disease)   .  Glaucoma   . Hyperlipidemia   . Osteoarthritis of glenohumeral joint 12/15/2012  . TIA (transient ischemic attack)     Past Surgical History:  Procedure Laterality Date  . HERNIA REPAIR Right 2015  . INGUINAL HERNIA REPAIR Left 10/08/2014   Procedure: LEFT INGUINAL HERNIORRHAPHY;  Surgeon: Aviva Signs, MD;  Location: [facility];  Service: General;  Laterality: Left;  . INSERTION OF MESH Left 10/08/2014   Procedure: INSERTION OF MESH;  Surgeon: Aviva Signs, MD;  Location: [facility];  Service: General;  Laterality: Left;  . KNEE SURGERY    . PROSTATECTOMY      Current Outpatient Medications  Medication Sig Dispense Refill  . acetaminophen (TYLENOL) 500 MG tablet Take 500 mg by mouth every 6 (six) hours as needed for pain.    Marland Kitchen atorvastatin (LIPITOR) 20 MG tablet TAKE 1 TABLET BY MOUTH  DAILY 90 tablet 3  . Cholecalciferol (VITAMIN D3) 2000 units TABS Take 1 tablet by mouth daily.    . clopidogrel (PLAVIX) 75 MG tablet TAKE 1 TABLET BY MOUTH  DAILY 90 tablet 3  . losartan (COZAAR) 25 MG tablet TAKE 1 TABLET BY MOUTH  DAILY 90 tablet 3  . pantoprazole (PROTONIX) 40 MG tablet Take 40 mg by mouth at bedtime.    . timolol (BETIMOL) 0.5 % ophthalmic solution Place 1 drop into both eyes daily.      No current facility-administered medications for this visit.   Allergies:  Patient has no known allergies.  Social History: The patient  reports that he has never smoked. He has never used smokeless tobacco. He reports that he does not drink alcohol and does not use drugs.   Family History: The patient's family history includes Breast cancer in his sister; Diabetes in his sister; Heart attack in his father.   ROS:  Please see the history of present illness. Otherwise, complete review of systems is positive for none.  All other systems are reviewed and negative.   Physical Exam: VS:  BP 122/64   Pulse 75   Ht 5\' 8"  (1.727 m)   Wt 163 lb (73.9 kg)   SpO2 95%   BMI 24.78 kg/m , BMI Body mass index  is 24.78 kg/m.  Wt Readings from Last 3 Encounters:  12/22/19 163 lb (73.9 kg)  12/19/18 165 lb (74.8 kg)  06/20/18 169 lb (76.7 kg)    General: Patient appears comfortable at rest. Neck: Supple, no elevated JVP or carotid bruits, no thyromegaly. Lungs: Clear to auscultation, nonlabored breathing at rest. Cardiac: Regular rate and rhythm, no S3 or significant systolic murmur, no pericardial rub. Extremities: No pitting edema, distal pulses 2+. Skin: Warm and dry. Musculoskeletal: No kyphosis. Neuropsychiatric: Alert and oriented x3, affect grossly appropriate.  ECG:  An ECG dated 12/22/2019 was personally reviewed today and demonstrated:  Normal sinus rhythm rate of 74, LVH with repolarization abnormality, cannot rule out septal infarct, age undetermined.  Recent Labwork: No results found for requested labs within last 8760 hours.  No results found for: CHOL, TRIG, HDL, CHOLHDL, VLDL, LDLCALC, LDLDIRECT  Other Studies Reviewed Today:  Echocardiogram 07/22/16 demonstrated normal left ventricular systolic function and regional wall motion, LVEF 60-65%, mild mitral posterior leaflet prolapse with mild to moderate regurgitation.  Nuclear stress test however demonstrated a medium-sized defect of moderate severity in the basal inferoseptal, basal inferior, mid inferoseptal, mid inferior, mid inferolateral, and apical inferior regions which was fixed consistent with prior myocardial infarction, calculated LVEF 42%  Assessment and Plan:  1. Chest pain, unspecified type   2. Abnormal stress test   3. Mitral valve insufficiency, unspecified etiology   4. Essential hypertension    1. Chest pain, unspecified type No significant anginal symptoms.  States he may feel some mild chest discomfort when performing slightly intense exertional activity occasionally but not consistently.  Denies any radiation to neck, arm, back, jaw.  No associated nausea, vomiting, diaphoresis.   2. Abnormal  stress test Previous abnormal stress test as noted with medium size defect of moderate severity and basal inferior septal, basal inferior, mid inferior septal, mid inferior and mid inferior lateral and apical inferior regions consistent with prior myocardial infarction.  3. Mitral valve insufficiency, unspecified etiology Last echo 2018 demonstrated mild to moderate MR.  No significant murmur heard on exam today.  Denies any significant exertional dyspnea/shortness of breath..  May need to get a repeat echo at next visit.  4. Essential hypertension Blood pressure well controlled on current medication.  Blood pressure today 122/64 Continue losartan 25 mg daily.  5.  History of TIA Continue Plavix 75 mg p.o. daily.  No recent CVA or TIA-like symptoms.   6.  Mixed hyperlipidemia. Continue atorvastatin 20 mg daily.  Patient states he recently had lab work with PCP.  There were no abnormals per his statement.  Medication Adjustments/Labs and Tests Ordered: Current medicines are reviewed at length with the patient today.  Concerns regarding medicines are outlined above.   Disposition: Follow-up with Dr. Harl Bowie or APP  1 year  Signed, Levell July, NP 12/22/2019 1:13 PM    [place] [facility] [place], [place], [place] [address] Phone: [phone]; Fax: [phone]

## 2019-12-22 ENCOUNTER — Encounter: Payer: Self-pay | Admitting: Family Medicine

## 2019-12-22 ENCOUNTER — Ambulatory Visit: Payer: Medicare HMO | Admitting: Cardiovascular Disease

## 2019-12-22 ENCOUNTER — Ambulatory Visit (INDEPENDENT_AMBULATORY_CARE_PROVIDER_SITE_OTHER): Payer: Medicare HMO | Admitting: Family Medicine

## 2019-12-22 VITALS — BP 122/64 | HR 75 | Ht 68.0 in | Wt 163.0 lb

## 2019-12-22 DIAGNOSIS — I340 Nonrheumatic mitral (valve) insufficiency: Secondary | ICD-10-CM | POA: Diagnosis not present

## 2019-12-22 DIAGNOSIS — R9439 Abnormal result of other cardiovascular function study: Secondary | ICD-10-CM | POA: Diagnosis not present

## 2019-12-22 DIAGNOSIS — I10 Essential (primary) hypertension: Secondary | ICD-10-CM | POA: Diagnosis not present

## 2019-12-22 DIAGNOSIS — R079 Chest pain, unspecified: Secondary | ICD-10-CM | POA: Diagnosis not present

## 2019-12-22 NOTE — Patient Instructions (Signed)
Your physician wants you to follow-up in: 1 YEAR WITH MD You will receive a reminder letter in the mail two months in advance. If you don't receive a letter, please call our office to schedule the follow-up appointment.  Your physician recommends that you continue on your current medications as directed. Please refer to the Current Medication list given to you today.  Thank you for choosing  [facility]!!   

## 2020-02-22 ENCOUNTER — Other Ambulatory Visit: Payer: Self-pay

## 2020-02-22 MED ORDER — ATORVASTATIN CALCIUM 20 MG PO TABS: 20.0000 mg | ORAL_TABLET | Freq: Every day | ORAL | 3 refills | Status: DC

## 2020-02-22 MED ORDER — CLOPIDOGREL BISULFATE 75 MG PO TABS: 75.0000 mg | ORAL_TABLET | Freq: Every day | ORAL | 3 refills | Status: DC

## 2020-02-22 NOTE — Telephone Encounter (Signed)
Refilled plavix and lipitor to [facility]

## 2020-05-15 ENCOUNTER — Other Ambulatory Visit: Payer: Self-pay

## 2020-05-15 MED ORDER — LOSARTAN POTASSIUM 25 MG PO TABS: 25.0000 mg | ORAL_TABLET | Freq: Every day | ORAL | 2 refills | Status: DC

## 2020-05-15 NOTE — Telephone Encounter (Signed)
Refilled losartan to [facility]. Pt to f/u in Oct 2022

## 2020-07-10 ENCOUNTER — Telehealth: Payer: Self-pay | Admitting: Family Medicine

## 2020-07-10 NOTE — Telephone Encounter (Signed)
Patient was in [facility] this weekend his bp was 200/120 they have him taking double the losartan to balance it out , please call to advise

## 2020-07-10 NOTE — Telephone Encounter (Signed)
Reports taking losartan 50 mg daily since weekend per [facility] ED instructions. Has been checking home BP 07/08/20 157/86 07/09/20 143/81 07/10/20 128/65 & 135/73 Says he was instructed by his PCP to contact his cardiologist for an appointment Advised to continue monitoring home BP and keep visit scheduled on 07/12/20 @1 :00 pm with [place] Verbalized understanding.

## 2020-07-12 ENCOUNTER — Ambulatory Visit (INDEPENDENT_AMBULATORY_CARE_PROVIDER_SITE_OTHER): Payer: Medicare HMO | Admitting: Student

## 2020-07-12 ENCOUNTER — Encounter: Payer: Self-pay | Admitting: Student

## 2020-07-12 ENCOUNTER — Other Ambulatory Visit: Payer: Self-pay

## 2020-07-12 ENCOUNTER — Encounter (INDEPENDENT_AMBULATORY_CARE_PROVIDER_SITE_OTHER): Payer: Self-pay

## 2020-07-12 VITALS — BP 138/80 | HR 80 | Ht 68.0 in | Wt 165.0 lb

## 2020-07-12 DIAGNOSIS — I10 Essential (primary) hypertension: Secondary | ICD-10-CM | POA: Diagnosis not present

## 2020-07-12 DIAGNOSIS — E785 Hyperlipidemia, unspecified: Secondary | ICD-10-CM

## 2020-07-12 DIAGNOSIS — R9439 Abnormal result of other cardiovascular function study: Secondary | ICD-10-CM

## 2020-07-12 DIAGNOSIS — Z79899 Other long term (current) drug therapy: Secondary | ICD-10-CM

## 2020-07-12 DIAGNOSIS — I340 Nonrheumatic mitral (valve) insufficiency: Secondary | ICD-10-CM | POA: Diagnosis not present

## 2020-07-12 MED ORDER — LOSARTAN POTASSIUM 25 MG PO TABS: 25.0000 mg | ORAL_TABLET | Freq: Two times a day (BID) | ORAL | 3 refills | Status: DC

## 2020-07-12 NOTE — Patient Instructions (Addendum)
Medication Instructions:  Your physician recommends that you continue on your current medications as directed. Please refer to the Current Medication list given to you today.  *If you need a refill on your cardiac medications before your next appointment, please call your pharmacy*   Lab Work: BMET- 2 weeks If you have labs (blood work) drawn today and your tests are completely normal, you will receive your results only by: Marland Kitchen MyChart Message (if you have MyChart) OR . A paper copy in the mail If you have any lab test that is abnormal or we need to change your treatment, we will call you to review the results.   Testing/Procedures: Your physician has requested that you have an echocardiogram. Echocardiography is a painless test that uses sound waves to create images of your heart. It provides your doctor with information about the size and shape of your heart and how well your heart's chambers and valves are working. This procedure takes approximately one hour. There are no restrictions for this procedure.     Follow-Up: At [facility], you and your health needs are our priority.  As part of our continuing mission to provide you with exceptional heart care, we have created designated Provider Care Teams.  These Care Teams include your primary Cardiologist (physician) and Advanced Practice Providers (APPs -  Physician Assistants and Nurse Practitioners) who all work together to provide you with the care you need, when you need it.  We recommend signing up for the patient portal called "MyChart".  Sign up information is provided on this After Visit Summary.  MyChart is used to connect with patients for Virtual Visits (Telemedicine).  Patients are able to view lab/test results, encounter notes, upcoming appointments, etc.  Non-urgent messages can be sent to your provider as well.   To learn more about what you can do with MyChart, go to [email or web address].    Your next appointment:   6  month(s)  The format for your next appointment:   In Person  Provider:   Bernerd Pho, PA-C or MD to establish care   Other Instructions   Two Gram Sodium Diet 2000 mg  What is Sodium? Sodium is a mineral found naturally in many foods. The most significant source of sodium in the diet is table salt, which is about 40% sodium.  Processed, convenience, and preserved foods also contain a large amount of sodium.  The body needs only 500 mg of sodium daily to function,  A normal diet provides more than enough sodium even if you do not use salt.  Why Limit Sodium? A build up of sodium in the body can cause thirst, increased blood pressure, shortness of breath, and water retention.  Decreasing sodium in the diet can reduce edema and risk of heart attack or stroke associated with high blood pressure.  Keep in mind that there are many other factors involved in these health problems.  Heredity, obesity, lack of exercise, cigarette smoking, stress and what you eat all play a role.  General Guidelines:  Do not add salt at the table or in cooking.  One teaspoon of salt contains over 2 grams of sodium.  Read food labels  Avoid processed and convenience foods  Ask your dietitian before eating any foods not dicussed in the menu planning guidelines  Consult your physician if you wish to use a salt substitute or a sodium containing medication such as antacids.  Limit milk and milk products to 16 oz (2 cups) per day.  Shopping Hints:  READ LABELS!! "Dietetic" does not necessarily mean low sodium.  Salt and other sodium ingredients are often added to foods during processing.   Menu Planning Guidelines Food Group Choose More Often Avoid  Beverages (see also the milk group All fruit juices, low-sodium, salt-free vegetables juices, low-sodium carbonated beverages Regular vegetable or tomato juices, commercially softened water used for drinking or cooking  Breads and Cereals Enriched white,  wheat, rye and pumpernickel bread, hard rolls and dinner rolls; muffins, cornbread and waffles; most dry cereals, cooked cereal without added salt; unsalted crackers and breadsticks; low sodium or homemade bread crumbs Bread, rolls and crackers with salted tops; quick breads; instant hot cereals; pancakes; commercial bread stuffing; self-rising flower and biscuit mixes; regular bread crumbs or cracker crumbs  Desserts and Sweets Desserts and sweets mad with mild should be within allowance Instant pudding mixes and cake mixes  Fats Butter or margarine; vegetable oils; unsalted salad dressings, regular salad dressings limited to 1 Tbs; light, sour and heavy cream Regular salad dressings containing bacon fat, bacon bits, and salt pork; snack dips made with instant soup mixes or processed cheese; salted nuts  Fruits Most fresh, frozen and canned fruits Fruits processed with salt or sodium-containing ingredient (some dried fruits are processed with sodium sulfites        Vegetables Fresh, frozen vegetables and low- sodium canned vegetables Regular canned vegetables, sauerkraut, pickled vegetables, and others prepared in brine; frozen vegetables in sauces; vegetables seasoned with ham, bacon or salt pork  Condiments, Sauces, Miscellaneous  Salt substitute with physician's approval; pepper, herbs, spices; vinegar, lemon or lime juice; hot pepper sauce; garlic powder, onion powder, low sodium soy sauce (1 Tbs.); low sodium condiments (ketchup, chili sauce, mustard) in limited amounts (1 tsp.) fresh ground horseradish; unsalted tortilla chips, pretzels, potato chips, popcorn, salsa (1/4 cup) Any seasoning made with salt including garlic salt, celery salt, onion salt, and seasoned salt; sea salt, rock salt, kosher salt; meat tenderizers; monosodium glutamate; mustard, regular soy sauce, barbecue, sauce, chili sauce, teriyaki sauce, steak sauce, Worcestershire sauce, and most flavored vinegars; canned gravy and  mixes; regular condiments; salted snack foods, olives, picles, relish, horseradish sauce, catsup   Food preparation: Try these seasonings Meats:    Pork Sage, onion Serve with applesauce  Chicken Poultry seasoning, thyme, parsley Serve with cranberry sauce  Lamb Curry powder, rosemary, garlic, thyme Serve with mint sauce or jelly  Veal Marjoram, basil Serve with current jelly, cranberry sauce  Beef Pepper, bay leaf Serve with dry mustard, unsalted chive butter  Fish [place] leaf, dill Serve with unsalted lemon butter, unsalted parsley butter  Vegetables:    Asparagus Lemon juice   Broccoli Lemon juice   Carrots Mustard dressing parsley, mint, nutmeg, glazed with unsalted butter and sugar   Green beans Marjoram, lemon juice, nutmeg,dill seed   Tomatoes Basil, marjoram, onion   Spice /blend for [employer]" 4 tsp ground thyme 1 tsp ground sage 3 tsp ground rosemary 4 tsp ground marjoram

## 2020-07-12 NOTE — Progress Notes (Signed)
Cardiology Office Note    Date:  07/13/2020   ID:  Roger Sutton, DOB [birth year], MRN [id number]  PCP:  Andres Shad, MD  Cardiologist: Previously followed by Dr. Bronson Ing --> Needs to switch to new MD  Chief Complaint  Patient presents with  . Follow-up    Recent Emergency Dept visit    History of Present Illness:    Roger Sutton is a 85 y.o. male with past medical history of presumed CAD (s/p NST in 2018 showing prior infarct with no current ischemia), mitral regurgitation, HTN, HLD and prior TIA who presents to the office today for evaluation of elevated BP.  He was last examined by Roger Dung, NP in 12/2019 and denied any recent chest pain or dyspnea on exertion but did report some dizziness with positional changes. Was recommended to consider a repeat echocardiogram at the time of his next visit to reassess his mitral regurgitation. He was continued on his current cardiac medications.  He called the office on 07/10/2020 reporting recent evaluation at [facility] ED in [place] for elevated BP of 200/120. He reported they increased his Losartan to 50 mg daily and BP had improved when checked at home. A follow-up visit was therefore arranged for further evaluation.   In talking with the patient and his friend today, he reports he was recently evaluated in the ED for elevated blood pressure and this had been gradually trending up over the past several days leading up to evaluation. He denies any changes in his dietary habits but says he has been consuming more frozen meals since his wife passed away.  He denies any specific exertional chest pain or dyspnea on exertion.  Reports an occasional "ache" along his chest which can occur sporadically. No recent orthopnea, PND or lower extremity edema.  He brings with him today a blood pressure log and his readings have significantly improved since Losartan was titrated at his recent ED visit but he reports dizziness after taking  this in the mornings.  Past Medical History:  Diagnosis Date  . Cancer [facility])    prostate  . Complication of anesthesia    Pt had a difficult time swallowing after surgery.  lost 20 lbs after hernia surgery  . GERD (gastroesophageal reflux disease)   . Glaucoma   . Hyperlipidemia   . Osteoarthritis of glenohumeral joint 12/15/2012  . TIA (transient ischemic attack)     Past Surgical History:  Procedure Laterality Date  . HERNIA REPAIR Right 2015  . INGUINAL HERNIA REPAIR Left 10/08/2014   Procedure: LEFT INGUINAL HERNIORRHAPHY;  Surgeon: Aviva Signs, MD;  Location: [facility] ORS;  Service: General;  Laterality: Left;  . INSERTION OF MESH Left 10/08/2014   Procedure: INSERTION OF MESH;  Surgeon: Aviva Signs, MD;  Location: [facility] ORS;  Service: General;  Laterality: Left;  . KNEE SURGERY    . PROSTATECTOMY      Current Medications: Outpatient Medications Prior to Visit  Medication Sig Dispense Refill  . acetaminophen (TYLENOL) 500 MG tablet Take 500 mg by mouth every 6 (six) hours as needed for pain.    Marland Kitchen atorvastatin (LIPITOR) 20 MG tablet Take 1 tablet (20 mg total) by mouth daily. 90 tablet 3  . Cholecalciferol (VITAMIN D3) 2000 units TABS Take 1 tablet by mouth daily.    . clopidogrel (PLAVIX) 75 MG tablet Take 1 tablet (75 mg total) by mouth daily. 90 tablet 3  . fluocinonide (LIDEX) 0.05 % external solution Apply 1 application topically 2 (  two) times daily.    . hydrocortisone ointment 0.5 % Apply 1 application topically 2 (two) times daily.    . pantoprazole (PROTONIX) 40 MG tablet Take 40 mg by mouth at bedtime.    . timolol (BETIMOL) 0.5 % ophthalmic solution Place 1 drop into both eyes daily.     Marland Kitchen losartan (COZAAR) 25 MG tablet Take 1 tablet (25 mg total) by mouth daily. (Patient taking differently: Take 50 mg by mouth daily.) 90 tablet 2   No facility-administered medications prior to visit.     Allergies:   Patient has no known allergies.   Social History   Socioeconomic  History  . Marital status: Married    Spouse name: Not on file  . Number of children: Not on file  . Years of education: Not on file  . Highest education level: Not on file  Occupational History  . Not on file  Tobacco Use  . Smoking status: Never Smoker  . Smokeless tobacco: Never Used  Vaping Use  . Vaping Use: Never used  Substance and Sexual Activity  . Alcohol use: No  . Drug use: No  . Sexual activity: Not Currently  Other Topics Concern  . Not on file  Social History Narrative  . Not on file   Social Determinants of Health   Financial Resource Strain: Not on file  Food Insecurity: Not on file  Transportation Needs: Not on file  Physical Activity: Not on file  Stress: Not on file  Social Connections: Not on file     Family History:  The patient's family history includes Breast cancer in his sister; Diabetes in his sister; Heart attack in his father.   Review of Systems:    Please see the history of present illness.     All other systems reviewed and are otherwise negative except as noted above.   Physical Exam:    VS:  BP 138/80   Pulse 80   Ht 5\' 8"  (1.727 m)   Wt 165 lb (74.8 kg)   SpO2 96%   BMI 25.09 kg/m    General: Pleasant elderly male appearing in no acute distress. Head: Normocephalic, atraumatic. Neck: No carotid bruits. JVD not elevated.  Lungs: Respirations regular and unlabored, without wheezes or rales.  Heart: Regular rate and rhythm. No S3 or S4. 2/6 systolic murmur.  Abdomen: Appears non-distended. No obvious abdominal masses. Msk:  Strength and tone appear normal for age. No obvious joint deformities or effusions. Extremities: No clubbing or cyanosis. No pitting edema.  Distal pedal pulses are 2+ bilaterally. Neuro: Alert and oriented X 3. Moves all extremities spontaneously. No focal deficits noted. Psych:  Responds to questions appropriately with a normal affect. Skin: No rashes or lesions noted  Wt Readings from Last 3  Encounters:  07/12/20 165 lb (74.8 kg)  12/22/19 163 lb (73.9 kg)  12/19/18 165 lb (74.8 kg)    Studies/Labs Reviewed:   EKG:  EKG is not ordered today.    Recent Labs: No results found for requested labs within last 8760 hours.   Lipid Panel No results found for: CHOL, TRIG, HDL, CHOLHDL, VLDL, LDLCALC, LDLDIRECT  Additional studies/ records that were reviewed today include:   NST: 06/2016  There was no ST segment deviation noted during stress.  Defect 1: There is a medium defect of moderate severity present in the basal inferoseptal, basal inferior, mid inferoseptal, mid inferior, mid inferolateral and apical inferior location.  Findings consistent with prior myocardial infarction.  This is an intermediate risk study.  Nuclear stress EF: 42%.  Limited Echo: 06/2016 Study Conclusions   - Left ventricle: The cavity size was normal. Wall thickness was  increased in a pattern of mild LVH. Systolic function was normal.  The estimated ejection fraction was in the range of 60% to 65%.  Wall motion was normal; there were no regional wall motion  abnormalities.  - Mitral valve: Mild posterior leaflet prolapse. There was mild to  moderate regurgitation.   Assessment:    1. Abnormal stress test   2. Mitral valve insufficiency, unspecified etiology   3. Essential hypertension   4. Medication management   5. Hyperlipidemia LDL goal <70      Plan:   In order of problems listed above:  1. Presumed CAD - Prior NST in 2018 showed evidence of a  prior infarct with no current ischemia. He denies any specific exertional chest pain and his breathing has been at baseline.  - Continue Plavix (on this per Neurology given prior TIA's) and Atorvastatin 20mg  daily.   2. Mitral regurgitation - He had mild to moderate MR by echo in 2018. Will plan to obtain an updated echocardiogram for reassessment.   3. HTN - BP was recently significantly elevated as outlined above and  has improved with titration of his medication regimen. BP is well-controlled at 138/80 during today's visit. He is currently taking Losartan 50mg  daily but experiences dizziness when taking this at once. Will adjust dosing to 25mg  BID. Will request records from [place]. Will plan for a repeat BMET in 2 weeks given recent titration of Losartan.   4. HLD - Followed by PCP. Will request most recent labs. Continue Atorvastatin 20mg  daily.    Medication Adjustments/Labs and Tests Ordered: Current medicines are reviewed at length with the patient today.  Concerns regarding medicines are outlined above.  Medication changes, Labs and Tests ordered today are listed in the Patient Instructions below. Patient Instructions   Medication Instructions:  Your physician recommends that you continue on your current medications as directed. Please refer to the Current Medication list given to you today.  *If you need a refill on your cardiac medications before your next appointment, please call your pharmacy*   Lab Work: BMET- 2 weeks If you have labs (blood work) drawn today and your tests are completely normal, you will receive your results only by: Marland Kitchen MyChart Message (if you have MyChart) OR . A paper copy in the mail If you have any lab test that is abnormal or we need to change your treatment, we will call you to review the results.   Testing/Procedures: Your physician has requested that you have an echocardiogram. Echocardiography is a painless test that uses sound waves to create images of your heart. It provides your doctor with information about the size and shape of your heart and how well your heart's chambers and valves are working. This procedure takes approximately one hour. There are no restrictions for this procedure.     Follow-Up: At [facility], you and your health needs are our priority.  As part of our continuing mission to provide you with exceptional heart care, we have created  designated Provider Care Teams.  These Care Teams include your primary Cardiologist (physician) and Advanced Practice Providers (APPs -  Physician Assistants and Nurse Practitioners) who all work together to provide you with the care you need, when you need it.  We recommend signing up for the patient portal called "MyChart".  Sign up information is provided on this After Visit Summary.  MyChart is used to connect with patients for Virtual Visits (Telemedicine).  Patients are able to view lab/test results, encounter notes, upcoming appointments, etc.  Non-urgent messages can be sent to your provider as well.   To learn more about what you can do with MyChart, go to [email or web address].    Your next appointment:   6 month(s)  The format for your next appointment:   In Person  Provider:   Bernerd Pho, PA-C or MD to establish care   Other Instructions   Two Gram Sodium Diet 2000 mg  What is Sodium? Sodium is a mineral found naturally in many foods. The most significant source of sodium in the diet is table salt, which is about 40% sodium.  Processed, convenience, and preserved foods also contain a large amount of sodium.  The body needs only 500 mg of sodium daily to function,  A normal diet provides more than enough sodium even if you do not use salt.  Why Limit Sodium? A build up of sodium in the body can cause thirst, increased blood pressure, shortness of breath, and water retention.  Decreasing sodium in the diet can reduce edema and risk of heart attack or stroke associated with high blood pressure.  Keep in mind that there are many other factors involved in these health problems.  Heredity, obesity, lack of exercise, cigarette smoking, stress and what you eat all play a role.  General Guidelines:  Do not add salt at the table or in cooking.  One teaspoon of salt contains over 2 grams of sodium.  Read food labels  Avoid processed and convenience foods  Ask your dietitian  before eating any foods not dicussed in the menu planning guidelines  Consult your physician if you wish to use a salt substitute or a sodium containing medication such as antacids.  Limit milk and milk products to 16 oz (2 cups) per day.  Shopping Hints:  READ LABELS!! "Dietetic" does not necessarily mean low sodium.  Salt and other sodium ingredients are often added to foods during processing.   Menu Planning Guidelines Food Group Choose More Often Avoid  Beverages (see also the milk group All fruit juices, low-sodium, salt-free vegetables juices, low-sodium carbonated beverages Regular vegetable or tomato juices, commercially softened water used for drinking or cooking  Breads and Cereals Enriched white, wheat, rye and pumpernickel bread, hard rolls and dinner rolls; muffins, cornbread and waffles; most dry cereals, cooked cereal without added salt; unsalted crackers and breadsticks; low sodium or homemade bread crumbs Bread, rolls and crackers with salted tops; quick breads; instant hot cereals; pancakes; commercial bread stuffing; self-rising flower and biscuit mixes; regular bread crumbs or cracker crumbs  Desserts and Sweets Desserts and sweets mad with mild should be within allowance Instant pudding mixes and cake mixes  Fats Butter or margarine; vegetable oils; unsalted salad dressings, regular salad dressings limited to 1 Tbs; light, sour and heavy cream Regular salad dressings containing bacon fat, bacon bits, and salt pork; snack dips made with instant soup mixes or processed cheese; salted nuts  Fruits Most fresh, frozen and canned fruits Fruits processed with salt or sodium-containing ingredient (some dried fruits are processed with sodium sulfites        Vegetables Fresh, frozen vegetables and low- sodium canned vegetables Regular canned vegetables, sauerkraut, pickled vegetables, and others prepared in brine; frozen vegetables in sauces; vegetables seasoned with ham, bacon or  salt pork  Condiments,  Sauces, Miscellaneous  Salt substitute with physician's approval; pepper, herbs, spices; vinegar, lemon or lime juice; hot pepper sauce; garlic powder, onion powder, low sodium soy sauce (1 Tbs.); low sodium condiments (ketchup, chili sauce, mustard) in limited amounts (1 tsp.) fresh ground horseradish; unsalted tortilla chips, pretzels, potato chips, popcorn, salsa (1/4 cup) Any seasoning made with salt including garlic salt, celery salt, onion salt, and seasoned salt; sea salt, rock salt, kosher salt; meat tenderizers; monosodium glutamate; mustard, regular soy sauce, barbecue, sauce, chili sauce, teriyaki sauce, steak sauce, Worcestershire sauce, and most flavored vinegars; canned gravy and mixes; regular condiments; salted snack foods, olives, picles, relish, horseradish sauce, catsup   Food preparation: Try these seasonings Meats:    Pork Sage, onion Serve with applesauce  Chicken Poultry seasoning, thyme, parsley Serve with cranberry sauce  Lamb Curry powder, rosemary, garlic, thyme Serve with mint sauce or jelly  Veal Marjoram, basil Serve with current jelly, cranberry sauce  Beef Pepper, bay leaf Serve with dry mustard, unsalted chive butter  Fish [place] leaf, dill Serve with unsalted lemon butter, unsalted parsley butter  Vegetables:    Asparagus Lemon juice   Broccoli Lemon juice   Carrots Mustard dressing parsley, mint, nutmeg, glazed with unsalted butter and sugar   Green beans Marjoram, lemon juice, nutmeg,dill seed   Tomatoes Basil, marjoram, onion   Spice /blend for [employer] [facility]" 4 tsp ground thyme 1 tsp ground sage 3 tsp ground rosemary 4 tsp ground marjoram     Signed, Erma Heritage, PA-C  07/13/2020 11:25 AM    [facility] [address] [place],  [address] Phone: [phone] Fax: [phone]

## 2020-07-13 ENCOUNTER — Encounter: Payer: Self-pay | Admitting: Student

## 2020-07-22 ENCOUNTER — Telehealth: Payer: Self-pay | Admitting: Student

## 2020-07-22 ENCOUNTER — Ambulatory Visit (HOSPITAL_COMMUNITY): Payer: Medicare HMO

## 2020-07-22 DIAGNOSIS — Z79899 Other long term (current) drug therapy: Secondary | ICD-10-CM

## 2020-07-22 NOTE — Telephone Encounter (Signed)
I called [facility]. They have not drawn patients blood since 2021. I clarified with patient, he felt sure he went to [facility] notes stated he wasn't to get lab drawn until 07/26/20. I placed order for BMET to be done at [facility] this Friday 07/26/20 he has echo scheduled on same day.

## 2020-07-22 NOTE — Telephone Encounter (Signed)
Patient called to see if Roger Sutton got his lab results from 07/15/20? He has not heard back from [place].

## 2020-07-26 ENCOUNTER — Other Ambulatory Visit: Payer: Self-pay

## 2020-07-26 ENCOUNTER — Ambulatory Visit (HOSPITAL_COMMUNITY)
Admission: RE | Admit: 2020-07-26 | Discharge: 2020-07-26 | Disposition: A | Discharge status: Home / Self Care | Payer: Medicare HMO | Source: Ambulatory Visit | Attending: Student | Admitting: Student

## 2020-07-26 ENCOUNTER — Other Ambulatory Visit (HOSPITAL_COMMUNITY)
Admission: RE | Admit: 2020-07-26 | Discharge: 2020-07-26 | Disposition: A | Discharge status: Home / Self Care | Payer: Medicare HMO | Source: Ambulatory Visit | Attending: Student | Admitting: Student

## 2020-07-26 DIAGNOSIS — I340 Nonrheumatic mitral (valve) insufficiency: Secondary | ICD-10-CM

## 2020-07-26 DIAGNOSIS — Z79899 Other long term (current) drug therapy: Secondary | ICD-10-CM | POA: Insufficient documentation

## 2020-07-26 LAB — BASIC METABOLIC PANEL
Anion gap: 5 (ref 5–15)
BUN: 19 mg/dL (ref 8–23)
CO2: 29 mmol/L (ref 22–32)
Calcium: 8.3 mg/dL — ABNORMAL LOW (ref 8.9–10.3)
Chloride: 102 mmol/L (ref 98–111)
Creatinine, Ser: 1.26 mg/dL — ABNORMAL HIGH (ref 0.61–1.24)
GFR, Estimated: 57 mL/min — ABNORMAL LOW (ref >60)
Glucose, Bld: 133 mg/dL — ABNORMAL HIGH (ref 70–99)
Potassium: 4.1 mmol/L (ref 3.5–5.1)
Sodium: 136 mmol/L (ref 135–145)

## 2020-07-26 LAB — ECHOCARDIOGRAM COMPLETE
Area-P 1/2: 1.93 cm2
S' Lateral: 3.1 cm

## 2020-07-26 NOTE — Progress Notes (Signed)
*  PRELIMINARY RESULTS* Echocardiogram 2D Echocardiogram has been performed.  Roger Sutton 07/26/2020, 2:40 PM

## 2020-08-02 ENCOUNTER — Ambulatory Visit: Payer: Medicare HMO | Admitting: Family Medicine

## 2020-08-19 ENCOUNTER — Telehealth: Payer: Self-pay | Admitting: Student

## 2020-08-19 DIAGNOSIS — Z79899 Other long term (current) drug therapy: Secondary | ICD-10-CM

## 2020-08-19 NOTE — Telephone Encounter (Signed)
New message    Does patient still need to have lab work done in 6 weeks ? He plans on going on vacation in July if he needs to have it done he needs orders for the end of this month

## 2020-08-19 NOTE — Telephone Encounter (Signed)
Spoke with pt and informed that labs were to be done in 4-6 weeks. Pt is now in that window and may have labs done at any time. Pt will come to [facility] lab to have done. Orders have been placed.

## 2020-08-20 ENCOUNTER — Other Ambulatory Visit (HOSPITAL_COMMUNITY)
Admission: RE | Admit: 2020-08-20 | Discharge: 2020-08-20 | Disposition: A | Discharge status: Home / Self Care | Payer: Medicare HMO | Source: Ambulatory Visit | Attending: Student | Admitting: Student

## 2020-08-20 DIAGNOSIS — Z79899 Other long term (current) drug therapy: Secondary | ICD-10-CM | POA: Insufficient documentation

## 2020-08-20 LAB — BASIC METABOLIC PANEL
Anion gap: 5 (ref 5–15)
BUN: 16 mg/dL (ref 8–23)
CO2: 28 mmol/L (ref 22–32)
Calcium: 8.8 mg/dL — ABNORMAL LOW (ref 8.9–10.3)
Chloride: 104 mmol/L (ref 98–111)
Creatinine, Ser: 1.19 mg/dL (ref 0.61–1.24)
GFR, Estimated: >60 mL/min (ref >60)
Glucose, Bld: 110 mg/dL — ABNORMAL HIGH (ref 70–99)
Potassium: 4.3 mmol/L (ref 3.5–5.1)
Sodium: 137 mmol/L (ref 135–145)

## 2021-01-26 ENCOUNTER — Other Ambulatory Visit: Payer: Self-pay | Admitting: Family Medicine

## 2021-05-22 ENCOUNTER — Other Ambulatory Visit: Payer: Self-pay | Admitting: Student

## 2021-06-19 NOTE — Progress Notes (Signed)
? ?Cardiology Office Note   ? ?Date:  06/21/2021  ? ?ID:  DALLAS TOROK, DOB 26-May-1935, MRN [id number] ? ?PCP:  Andres Shad, MD  ?Cardiologist: Previously followed by Dr. Bronson Ing --> Needs to switch to new MD ? ?Chief Complaint  ?Patient presents with  ? Follow-up  ?  6 month visit  ? ? ?History of Present Illness:   ? ?Roger Sutton is a 86 y.o. male with past medical history of presumed CAD (s/p NST in 2018 showing prior infarct with no current ischemia), mitral regurgitation, HTN, HLD and prior TIA who presents to the office today for 72-monthfollow-up.  ? ?He was last examined by myself in 06/2020 and reported his blood pressure had recently been elevated. He had been consuming more frozen meals since the passing of his wife and it was thought his dietary changes could be contributing. Losartan had recently been titrated to '50mg'$  daily with improvement in his readings but he reported dizziness with taking this, therefore dosing was divided out to '25mg'$  BID. Given his history of mitral regurgitation, a follow-up echocardiogram was recommended and showed a preserved EF of 55-60% with no regional WMA and his mitral regurgitation remained in a mild range.  ? ?In talking with the patient today, he reports overall doing well from a cardiac perspective since his last office visit. He remains active at baseline as he does the chores around the house and push mows his yard. Says that he does not "rush" through this but denies any recent exertional chest pain or progressive dyspnea on exertion. Does report occasional paresthesias along his right arm and has been working with PT given sciatica along his right leg. Denies any specific abdominal distention or lower extremity edema. No recent orthopnea or PND. His dizziness did improve with adjusting Losartan to divided dosing and he denies any recurrent dizziness or presyncope. ? ?Past Medical History:  ?Diagnosis Date  ? Cancer ([facility]   ? prostate  ?  Complication of anesthesia   ? Pt had a difficult time swallowing after surgery.  lost 20 lbs after hernia surgery  ? GERD (gastroesophageal reflux disease)   ? Glaucoma   ? Hyperlipidemia   ? Osteoarthritis of glenohumeral joint 12/15/2012  ? TIA (transient ischemic attack)   ? ? ?Past Surgical History:  ?Procedure Laterality Date  ? CATARACT EXTRACTION Bilateral   ? HERNIA REPAIR Right 2015  ? INGUINAL HERNIA REPAIR Left 10/08/2014  ? Procedure: LEFT INGUINAL HERNIORRHAPHY;  Surgeon: MAviva Signs MD;  Location: [facility];  Service: General;  Laterality: Left;  ? INSERTION OF MESH Left 10/08/2014  ? Procedure: INSERTION OF MESH;  Surgeon: MAviva Signs MD;  Location: [facility];  Service: General;  Laterality: Left;  ? KNEE SURGERY    ? PROSTATECTOMY    ? ? ?Current Medications: ?Outpatient Medications Prior to Visit  ?Medication Sig Dispense Refill  ? acetaminophen (TYLENOL) 500 MG tablet Take 500 mg by mouth every 6 (six) hours as needed for pain.    ? atorvastatin (LIPITOR) 20 MG tablet TAKE 1 TABLET BY MOUTH  DAILY 90 tablet 3  ? Cholecalciferol (VITAMIN D3) 2000 units TABS Take 1 tablet by mouth daily.    ? clopidogrel (PLAVIX) 75 MG tablet TAKE 1 TABLET BY MOUTH  DAILY 90 tablet 3  ? fluocinonide (LIDEX) 0.05 % external solution Apply 1 application topically 2 (two) times daily.    ? losartan (COZAAR) 25 MG tablet TAKE 1 TABLET BY MOUTH  TWICE DAILY  180 tablet 3  ? pantoprazole (PROTONIX) 40 MG tablet Take 40 mg by mouth at bedtime.    ? hydrocortisone ointment 0.5 % Apply 1 application topically 2 (two) times daily. (Patient not taking: Reported on 06/20/2021)    ? timolol (BETIMOL) 0.5 % ophthalmic solution Place 1 drop into both eyes daily.  (Patient not taking: Reported on 06/20/2021)    ? ?No facility-administered medications prior to visit.  ?  ? ?Allergies:   Patient has no known allergies.  ? ?Social History  ? ?Socioeconomic History  ? Marital status: Married  ?  Spouse name: Not on file  ? Number of  children: Not on file  ? Years of education: Not on file  ? Highest education level: Not on file  ?Occupational History  ? Not on file  ?Tobacco Use  ? Smoking status: Never  ? Smokeless tobacco: Never  ?Vaping Use  ? Vaping Use: Never used  ?Substance and Sexual Activity  ? Alcohol use: No  ? Drug use: No  ? Sexual activity: Not Currently  ?Other Topics Concern  ? Not on file  ?Social History Narrative  ? Not on file  ? ?Social Determinants of Health  ? ?Financial Resource Strain: Not on file  ?Food Insecurity: Not on file  ?Transportation Needs: Not on file  ?Physical Activity: Not on file  ?Stress: Not on file  ?Social Connections: Not on file  ?  ? ?Family History:  The patient's family history includes Breast cancer in his sister; Diabetes in his sister; Heart attack in his father.  ? ?Review of Systems:   ? ?Please see the history of present illness.    ? ?All other systems reviewed and are otherwise negative except as noted above. ? ? ?Physical Exam:   ? ?VS:  BP 132/84   Pulse 78   Ht '5\' 8"'$  (1.727 m)   Wt 171 lb 12.8 oz (77.9 kg)   SpO2 97%   BMI 26.12 kg/m?    ?General: Pleasant elderly male appearing in no acute distress. ?Head: Normocephalic, atraumatic. ?Neck: No carotid bruits. JVD not elevated.  ?Lungs: Respirations regular and unlabored, without wheezes or rales.  ?Heart: Regular rate and rhythm. No S3 or S4.  No murmur, no rubs, or gallops appreciated. ?Abdomen: Appears non-distended. No obvious abdominal masses. ?Msk:  Strength and tone appear normal for age. No obvious joint deformities or effusions. ?Extremities: No clubbing or cyanosis. No pitting edema.  Distal pedal pulses are 2+ bilaterally. ?Neuro: Alert and oriented X 3. Moves all extremities spontaneously. No focal deficits noted. ?Psych:  Responds to questions appropriately with a normal affect. ?Skin: No rashes or lesions noted ? ?Wt Readings from Last 3 Encounters:  ?06/20/21 171 lb 12.8 oz (77.9 kg)  ?07/12/20 165 lb (74.8 kg)   ?12/22/19 163 lb (73.9 kg)  ?  ? ?Studies/Labs Reviewed:  ? ?EKG:  EKG is ordered today. The ekg ordered today demonstrates normal sinus rhythm, heart rate 78 with LVH and associated repol abnormalities which is overall similar to prior tracings. ? ?Recent Labs: ?08/20/2020: BUN 16; Creatinine, Ser 1.19; Potassium 4.3; Sodium 137  ? ?Lipid Panel ?No results found for: CHOL, TRIG, HDL, CHOLHDL, VLDL, LDLCALC, LDLDIRECT ? ?Additional studies/ records that were reviewed today include:  ? ?NST: 06/2016 ?There was no ST segment deviation noted during stress. ?Defect 1: There is a medium defect of moderate severity present in the basal inferoseptal, basal inferior, mid inferoseptal, mid inferior, mid inferolateral and apical inferior location. ?  Findings consistent with prior myocardial infarction. ?This is an intermediate risk study. ?Nuclear stress EF: 42%. ? ?Echocardiogram: 06/2020 ?IMPRESSIONS  ? ? ? 1. Left ventricular ejection fraction, by estimation, is 55 to 60%. The  ?left ventricle has normal function. The left ventricle has no regional  ?wall motion abnormalities. There is mild left ventricular hypertrophy.  ?Left ventricular diastolic parameters  ?are consistent with Grade I diastolic dysfunction (impaired relaxation).  ? 2. RV-RA gradient normal at 26 mmHg. Right ventricular systolic function  ?is normal. The right ventricular size is normal.  ? 3. Left atrial size was mildly dilated.  ? 4. The mitral valve is abnormal. Mild mitral valve regurgitation.  ? 5. The aortic valve is tricuspid. Aortic valve regurgitation is not  ?visualized. Mild aortic valve sclerosis is present, with no evidence of  ?aortic valve stenosis.  ? 6. Unable to estimate CVP.  ? ?Assessment:   ? ?1. Abnormal stress test   ?2. Essential hypertension   ?3. Hyperlipidemia LDL goal <70   ?4. Mitral valve insufficiency, unspecified etiology   ? ? ? ?Plan:  ? ?In order of problems listed above: ? ?1. CAD ?- Prior NST in 2018 showed evidence  of prior infarct with no current ischemia. He does remain active for his age and denies any recent anginal symptoms. ?- Continue current medical therapy with Plavix 75 mg daily (on this per Neurology), Losartan 25 mg t

## 2021-06-20 ENCOUNTER — Ambulatory Visit: Payer: Medicare HMO | Admitting: Student

## 2021-06-20 ENCOUNTER — Encounter: Payer: Self-pay | Admitting: Student

## 2021-06-20 VITALS — BP 132/84 | HR 78 | Ht 68.0 in | Wt 171.8 lb

## 2021-06-20 DIAGNOSIS — I10 Essential (primary) hypertension: Secondary | ICD-10-CM | POA: Diagnosis not present

## 2021-06-20 DIAGNOSIS — E785 Hyperlipidemia, unspecified: Secondary | ICD-10-CM | POA: Diagnosis not present

## 2021-06-20 DIAGNOSIS — I340 Nonrheumatic mitral (valve) insufficiency: Secondary | ICD-10-CM | POA: Diagnosis not present

## 2021-06-20 DIAGNOSIS — R9439 Abnormal result of other cardiovascular function study: Secondary | ICD-10-CM | POA: Diagnosis not present

## 2021-06-20 NOTE — Patient Instructions (Signed)
Medication Instructions:  Your physician recommends that you continue on your current medications as directed. Please refer to the Current Medication list given to you today.   Labwork: None today  Testing/Procedures: None today  Follow-Up: 1 year  Any Other Special Instructions Will Be Listed Below (If Applicable).  If you need a refill on your cardiac medications before your next appointment, please call your pharmacy.  

## 2021-12-19 ENCOUNTER — Other Ambulatory Visit: Payer: Self-pay | Admitting: Cardiology

## 2022-01-12 ENCOUNTER — Ambulatory Visit: Payer: Medicare HMO | Attending: Internal Medicine | Admitting: Internal Medicine

## 2022-01-12 ENCOUNTER — Encounter: Payer: Self-pay | Admitting: Internal Medicine

## 2022-01-12 VITALS — BP 130/68 | HR 82 | Ht 68.0 in | Wt 170.0 lb

## 2022-01-12 DIAGNOSIS — R079 Chest pain, unspecified: Secondary | ICD-10-CM

## 2022-01-12 DIAGNOSIS — I10 Essential (primary) hypertension: Secondary | ICD-10-CM | POA: Diagnosis not present

## 2022-01-12 DIAGNOSIS — I340 Nonrheumatic mitral (valve) insufficiency: Secondary | ICD-10-CM | POA: Insufficient documentation

## 2022-01-12 DIAGNOSIS — Z8673 Personal history of transient ischemic attack (TIA), and cerebral infarction without residual deficits: Secondary | ICD-10-CM | POA: Diagnosis not present

## 2022-01-12 MED ORDER — NITROGLYCERIN 0.4 MG SL SUBL: 0.4000 mg | SUBLINGUAL_TABLET | SUBLINGUAL | 3 refills | Status: DC | PRN

## 2022-01-12 NOTE — Progress Notes (Signed)
Cardiology Office Note  Date: 01/12/2022   ID: Roger Sutton, DOB 1935-07-03, MRN [id number]  PCP:  Andres Shad, MD  Cardiologist:  None Electrophysiologist:  None   Reason for Office Visit: Follow-up visit for management of risk factors   History of Present Illness: Roger Sutton is a 86 y.o. male known to have HTN, HLD, history of TIA, abnormal nuclear stress test in 2018 (evidence of prior MI but no ischemia) presented to cardiology clinic for follow-up visit.  Patient had ER visit a few weeks ago due to chest pain lasting for the whole day. He went [place] the [place] to put flowers on his wife, then went home rested for quite a bit which was when he started to have sudden onset of chest pains across his precordium.  He called his daughter who recommended him to call 911 and go to the ER.  He had ER visit on that day, EKG and troponins were within normal limits and he was discharged eventually.  He denied any further chest pains upon discharge from the ER.  He stated that the last time he had chest pain (apart from the current episode was in 2018-19).  Denied DOE, dizziness, lightness, syncope, palpitations, LE swelling. However he complained of muscle cramps in his legs at rest and not with exertion.  Past Medical History:  Diagnosis Date   Cancer [facility])    prostate   Complication of anesthesia    Pt had a difficult time swallowing after surgery.  lost 20 lbs after hernia surgery   GERD (gastroesophageal reflux disease)    Glaucoma    Hyperlipidemia    Osteoarthritis of glenohumeral joint 12/15/2012   TIA (transient ischemic attack)     Past Surgical History:  Procedure Laterality Date   CATARACT EXTRACTION Bilateral    HERNIA REPAIR Right 2015   INGUINAL HERNIA REPAIR Left 10/08/2014   Procedure: LEFT INGUINAL HERNIORRHAPHY;  Surgeon: Aviva Signs, MD;  Location: [facility];  Service: General;  Laterality: Left;   INSERTION OF MESH Left 10/08/2014    Procedure: INSERTION OF MESH;  Surgeon: Aviva Signs, MD;  Location: [facility];  Service: General;  Laterality: Left;   KNEE SURGERY     PROSTATECTOMY      Current Outpatient Medications  Medication Sig Dispense Refill   acetaminophen (TYLENOL) 500 MG tablet Take 500 mg by mouth every 6 (six) hours as needed for pain.     atorvastatin (LIPITOR) 20 MG tablet TAKE 1 TABLET BY MOUTH ONCE  DAILY 90 tablet 3   Cholecalciferol (VITAMIN D3) 2000 units TABS Take 1 tablet by mouth daily.     clopidogrel (PLAVIX) 75 MG tablet TAKE 1 TABLET BY MOUTH DAILY 90 tablet 3   fluocinonide (LIDEX) 0.05 % external solution Apply 1 application topically 2 (two) times daily.     losartan (COZAAR) 25 MG tablet TAKE 1 TABLET BY MOUTH  TWICE DAILY 180 tablet 3   pantoprazole (PROTONIX) 40 MG tablet Take 40 mg by mouth at bedtime.     No current facility-administered medications for this visit.   Allergies:  Patient has no known allergies.   Social History: The patient  reports that he has never smoked. He has never used smokeless tobacco. He reports that he does not drink alcohol and does not use drugs.   Family History: The patient's family history includes Breast cancer in his sister; Diabetes in his sister; Heart attack in his father.   ROS:  Please see  the history of present illness. Otherwise, complete review of systems is positive for none.  All other systems are reviewed and negative.   Physical Exam: VS:  Ht '5\' 8"'$  (1.727 m)   Wt 170 lb (77.1 kg)   BMI 25.85 kg/m , BMI Body mass index is 25.85 kg/m.  Wt Readings from Last 3 Encounters:  01/12/22 170 lb (77.1 kg)  06/20/21 171 lb 12.8 oz (77.9 kg)  07/12/20 165 lb (74.8 kg)    General: Patient appears comfortable at rest. HEENT: Conjunctiva and lids normal, oropharynx clear with moist mucosa. Neck: Supple, no elevated JVP or carotid bruits, no thyromegaly. Lungs: Clear to auscultation, nonlabored breathing at rest. Cardiac: Regular rate and  rhythm, no S3 or significant systolic murmur, no pericardial rub. Abdomen: Soft, nontender, no hepatomegaly, bowel sounds present, no guarding or rebound. Extremities: No pitting edema, distal pulses 2+. Skin: Warm and dry. Musculoskeletal: No kyphosis. Neuropsychiatric: Alert and oriented x3, affect grossly appropriate.  ECG:  An ECG dated today was personally reviewed today and demonstrated:  NSR  Recent Labwork: No results found for requested labs within last 365 days.  No results found for: "CHOL", "TRIG", "HDL", "CHOLHDL", "VLDL", "LDLCALC", "LDLDIRECT"  Other Studies Reviewed Today: Echo in May 2022 LVEF 55 to 20% Grade 1 diastolic dysfunction Mild MR  NM stress test in 5/18 There was no ST segment deviation noted during stress. Defect 1: There is a medium defect of moderate severity present in the basal inferoseptal, basal inferior, mid inferoseptal, mid inferior, mid inferolateral and apical inferior location. Findings consistent with prior myocardial infarction. This is an intermediate risk study. Nuclear stress EF: 42%.  Assessment and Plan: Patient is 86 year old M known to have HTN, HLD, mild MR, TIA presented to cardiology clinic for follow-up visit.  #Abnormal nuclear stress test #Chest pain prompting ER visit a few days ago -Patient had evidence of prior MI and no ischemia on NM stress test from 2018.  Repeat exercise Myoview test to rule out ischemia.  #History of TIA -Continue Plavix 75 mg once daily -Continue moderate intensity statin, atorvastatin 20 mg nightly  #HTN, controlled -Continue losartan 25 mg once daily  #HLD, currently at goal (LDL goal less than 70) -Continue atorvastatin 20 mg nightly.  LDL 43 from 3/23.  #Mild MR -Echo surveillance every 3 years, next echo due in 2025.  I have spent a total of 36 minutes with patient reviewing chart, EKGs, labs and examining patient as well as establishing an assessment and plan that was discussed with  the patient.  > 50% of time was spent in direct patient care.     Medication Adjustments/Labs and Tests Ordered: Current medicines are reviewed at length with the patient today.  Concerns regarding medicines are outlined above.   Tests Ordered: Orders Placed This Encounter  Procedures   EKG 12-Lead    Medication Changes: No orders of the defined types were placed in this encounter.   Disposition:  Follow up one year  Signed, Marisal Swarey Fidel Levy, MD, 01/12/2022 12:57 PM    [facility] [address], [place], [place] [address]

## 2022-01-12 NOTE — Patient Instructions (Signed)
Medication Instructions:  Your physician has recommended you make the following change in your medication:   Start SL Nitro 0.4 mg tablets as needed for chest pain- May take up to 3 tablets (1 every 5 minutes).   Labwork: None  Testing/Procedures: Your physician has requested that you have en exercise stress myoview. For further information please visit [email or web address]. Please follow instruction sheet, as given.   Follow-Up: Follow up with Dr. Dellia Cloud in 6 months.   Any Other Special Instructions Will Be Listed Below (If Applicable).     If you need a refill on your cardiac medications before your next appointment, please call your pharmacy.

## 2022-01-13 ENCOUNTER — Encounter: Payer: Self-pay | Admitting: Internal Medicine

## 2022-01-13 NOTE — Telephone Encounter (Signed)
Error

## 2022-01-15 ENCOUNTER — Encounter (HOSPITAL_COMMUNITY)
Admission: RE | Admit: 2022-01-15 | Discharge: 2022-01-15 | Disposition: A | Discharge status: Home / Self Care | Payer: Medicare HMO | Source: Ambulatory Visit | Attending: Internal Medicine | Admitting: Internal Medicine

## 2022-01-15 ENCOUNTER — Encounter (HOSPITAL_COMMUNITY): Payer: Self-pay

## 2022-01-15 ENCOUNTER — Ambulatory Visit (HOSPITAL_COMMUNITY)
Admission: RE | Admit: 2022-01-15 | Discharge: 2022-01-15 | Disposition: A | Discharge status: Home / Self Care | Payer: Medicare HMO | Source: Ambulatory Visit | Attending: Internal Medicine | Admitting: Internal Medicine

## 2022-01-15 DIAGNOSIS — R079 Chest pain, unspecified: Secondary | ICD-10-CM | POA: Insufficient documentation

## 2022-01-15 HISTORY — DX: Essential (primary) hypertension: I10

## 2022-01-15 LAB — NM MYOCAR MULTI W/SPECT W/WALL MOTION / EF
Angina Index: 0
Duke Treadmill Score: 3
Estimated workload: 4.6
Exercise duration (min): 3 min
Exercise duration (sec): 10 s
LV dias vol: 103 mL (ref 62–150)
LV sys vol: 40 mL
MPHR: 136 {beats}/min
Nuc Stress EF: 61 %
Peak HR: 121 {beats}/min
Percent HR: 88 %
RATE: 0.5
RPE: 17
Rest HR: 64 {beats}/min
Rest Nuclear Isotope Dose: 11 mCi
SDS: 2
SRS: 12
SSS: 14
ST Depression (mm): 0 mm
Stress Nuclear Isotope Dose: 32 mCi
TID: 0.88

## 2022-01-15 MED ORDER — REGADENOSON 0.4 MG/5ML IV SOLN
INTRAVENOUS | Status: AC
  Filled 2022-01-15: qty 5

## 2022-01-15 MED ORDER — TECHNETIUM TC 99M TETROFOSMIN IV KIT
10.0000 | PACK | Freq: Once | INTRAVENOUS | Status: AC | PRN
  Administered 2022-01-15: 11 via INTRAVENOUS

## 2022-01-15 MED ORDER — SODIUM CHLORIDE FLUSH 0.9 % IV SOLN
INTRAVENOUS | Status: AC
  Filled 2022-01-15: qty 10

## 2022-01-15 MED ORDER — TECHNETIUM TC 99M TETROFOSMIN IV KIT
30.0000 | PACK | Freq: Once | INTRAVENOUS | Status: AC | PRN
  Administered 2022-01-15: 32 via INTRAVENOUS

## 2022-01-20 ENCOUNTER — Telehealth: Payer: Self-pay

## 2022-01-20 NOTE — Telephone Encounter (Signed)
-----   Message from Chalmers Guest, MD sent at 01/16/2022 12:17 PM EST ----- Normal stress test

## 2022-01-20 NOTE — Telephone Encounter (Signed)
Patient notified and verbalized understanding. Patient had no questions or concerns at this time. PCP copied 

## 2022-04-24 ENCOUNTER — Other Ambulatory Visit: Payer: Self-pay | Admitting: Student

## 2022-06-30 ENCOUNTER — Ambulatory Visit: Payer: Medicare HMO | Admitting: Internal Medicine

## 2022-07-17 ENCOUNTER — Telehealth: Payer: Self-pay | Admitting: Internal Medicine

## 2022-07-17 NOTE — Telephone Encounter (Signed)
Pt c/o of Chest Pain: STAT if CP now or developed within 24 hours  1. Are you having CP right now? Not at this time- went to the ER in [place] this morning around 12:00 AM  2. Are you experiencing any other symptoms (ex. SOB, nausea, vomiting, sweating)?a little shortness of breath at this time  3. How long have you been experiencing CP? Started yesterday, got worse during  the night  4. Is your CP continuous or coming and going? It comes and goess  5. Have you taken Nitroglycerin? No- went [place]pthe [place] in [place]. He was told to see his Cardiologist and PCP.  He has the first avilable appointment for 07-30-22- please call to advise ?

## 2022-07-17 NOTE — Telephone Encounter (Signed)
Reviewed chart, had normal nuclear stress test 12/2021  Was seen and released from [facility] this am after lab work,EKG, and monitoring obtained.  No CP or SOB at this time.  Will f/u on 5/29 at the [facility] office

## 2022-07-23 ENCOUNTER — Ambulatory Visit: Payer: Medicare HMO | Attending: Nurse Practitioner | Admitting: Nurse Practitioner

## 2022-07-23 VITALS — Ht 68.0 in | Wt 164.4 lb

## 2022-07-23 DIAGNOSIS — I10 Essential (primary) hypertension: Secondary | ICD-10-CM

## 2022-07-23 NOTE — Progress Notes (Signed)
Scheduled for office visit today. Will reschedule d/t losing power in office.

## 2022-07-28 ENCOUNTER — Encounter: Payer: Self-pay | Admitting: *Deleted

## 2022-07-28 ENCOUNTER — Encounter: Payer: Self-pay | Admitting: Nurse Practitioner

## 2022-07-28 ENCOUNTER — Ambulatory Visit: Payer: Medicare HMO | Attending: Nurse Practitioner | Admitting: Nurse Practitioner

## 2022-07-28 VITALS — BP 126/82 | HR 87 | Ht 68.0 in | Wt 167.2 lb

## 2022-07-28 DIAGNOSIS — E785 Hyperlipidemia, unspecified: Secondary | ICD-10-CM

## 2022-07-28 DIAGNOSIS — R0789 Other chest pain: Secondary | ICD-10-CM

## 2022-07-28 DIAGNOSIS — I10 Essential (primary) hypertension: Secondary | ICD-10-CM

## 2022-07-28 DIAGNOSIS — Z8673 Personal history of transient ischemic attack (TIA), and cerebral infarction without residual deficits: Secondary | ICD-10-CM | POA: Diagnosis not present

## 2022-07-28 DIAGNOSIS — I340 Nonrheumatic mitral (valve) insufficiency: Secondary | ICD-10-CM

## 2022-07-28 MED ORDER — ATORVASTATIN CALCIUM 20 MG PO TABS: 20.0000 mg | ORAL_TABLET | Freq: Every day | ORAL | Status: DC

## 2022-07-28 NOTE — Patient Instructions (Addendum)
Medication Instructions:   Hold Atorvastatin x 1-2 weeks & then update the office on your symptoms  Continue all other medications.     Labwork:  none  Testing/Procedures:  none  Follow-Up:  6 months   Any Other Special Instructions Will Be Listed Below (If Applicable).   If you need a refill on your cardiac medications before your next appointment, please call your pharmacy.

## 2022-07-28 NOTE — Progress Notes (Signed)
Office Visit    Patient Name: Roger Sutton Date of Encounter: 07/28/2022  PCP:  Arlina Robes, MD   [facility]  Cardiologist:  Marjo Bicker, MD  Advanced Practice Provider:  No care team member to display Electrophysiologist:  None   Chief Complaint    Roger Sutton is a 87 y.o. male with a hx of hypertension, hyperlipidemia, abnormal stress test in 2018 (evidence of prior MI, negative for ischemia), history of TIA, mild mitral regurgitation, who presents today for hospital follow-up.  Past Medical History    Past Medical History:  Diagnosis Date   Cancer [facility]    prostate   Complication of anesthesia    Pt had a difficult time swallowing after surgery.  lost 20 lbs after hernia surgery   GERD (gastroesophageal reflux disease)    Glaucoma    Hyperlipidemia    Hypertension    Osteoarthritis of glenohumeral joint 12/15/2012   TIA (transient ischemic attack)    Past Surgical History:  Procedure Laterality Date   CATARACT EXTRACTION Bilateral    HERNIA REPAIR Right 2015   INGUINAL HERNIA REPAIR Left 10/08/2014   Procedure: LEFT INGUINAL HERNIORRHAPHY;  Surgeon: Franky Macho, MD;  Location: [facility];  Service: General;  Laterality: Left;   INSERTION OF MESH Left 10/08/2014   Procedure: INSERTION OF MESH;  Surgeon: Franky Macho, MD;  Location: [facility];  Service: General;  Laterality: Left;   KNEE SURGERY     PROSTATECTOMY      Allergies  No Known Allergies  History of Present Illness    Roger Sutton is a 87 y.o. male with a PMH as mentioned above.  ED visit last fall due to chest pain.  Described as sudden onset across precordium.  Workup was overall unremarkable.  Last seen by Dr. Jenene Slicker on January 12, 2022.  Was overall doing well at that time.  Myoview was arranged and was normal.  Had recent ED visit in [place], was a little short of breath and noted chest pain.  Was told to follow-up with  cardiologist and PCP.  Today he presents for hospital follow-up.  He states his workup at [facility] ED was good, was sent home. I do not have these records and will request records to be faxed to our office. Blood work was also taken, do not have results. Admits to occasional chest pain at night intermittently, noted bilaterally across precordium. He says he is not sure what this is due to, does admit to generalized arthritis and occasional cramping in lower extremities. Admits to CP today while speaking with pt, 1/10, across precordium, described as "tingling" sensation, denies any exertional symptoms. Denies any shortness of breath, palpitations, syncope, presyncope, dizziness, orthopnea, PND, swelling or significant weight changes, acute bleeding, or claudication.   EKGs/Labs/Other Studies Reviewed:   The following studies were reviewed today:   EKG:  EKG is ordered today.  The ekg ordered today demonstrates normal sinus rhythm, no acute ischemic changes.  Myoview 12/2021:   No ST deviation was noted during stress   There is a small defect with mild reduction in uptake present in the basal inferior and apical location(s) that is fixed with normal wall motion in the defect area, mostly consistent with artifact caused by subdiaphragmatic/ extracardiac activity and apical thinning versus prior infarction, although infarction is unlikely.   No evidence of current ischemia.   Left ventricular function is normal.   The study is normal. The study is  low risk.  Echocardiogram 06/2020: 1. Left ventricular ejection fraction, by estimation, is 55 to 60%. The  left ventricle has normal function. The left ventricle has no regional  wall motion abnormalities. There is mild left ventricular hypertrophy.  Left ventricular diastolic parameters  are consistent with Grade I diastolic dysfunction (impaired relaxation).   2. RV-RA gradient normal at 26 mmHg. Right ventricular systolic function  is normal. The right  ventricular size is normal.   3. Left atrial size was mildly dilated.   4. The mitral valve is abnormal. Mild mitral valve regurgitation.   5. The aortic valve is tricuspid. Aortic valve regurgitation is not  visualized. Mild aortic valve sclerosis is present, with no evidence of  aortic valve stenosis.   6. Unable to estimate CVP.  Recent Labs: No results found for requested labs within last 365 days.  Recent Lipid Panel No results found for: "CHOL", "TRIG", "HDL", "CHOLHDL", "VLDL", "LDLCALC", "LDLDIRECT"  Home Medications   Current Meds  Medication Sig   acetaminophen (TYLENOL) 500 MG tablet Take 500 mg by mouth every 6 (six) hours as needed for pain.   cetirizine (ZYRTEC) 10 MG tablet Take 10 mg by mouth daily.   Cholecalciferol (VITAMIN D3) 2000 units TABS Take 1 tablet by mouth daily.   clopidogrel (PLAVIX) 75 MG tablet TAKE 1 TABLET BY MOUTH DAILY   fluocinonide (LIDEX) 0.05 % external solution Apply 1 application topically 2 (two) times daily.   losartan (COZAAR) 25 MG tablet TAKE 1 TABLET BY MOUTH TWICE  DAILY   nitroGLYCERIN (NITROSTAT) 0.4 MG SL tablet Place 1 tablet (0.4 mg total) under the tongue every 5 (five) minutes as needed for chest pain.   pantoprazole (PROTONIX) 40 MG tablet Take 40 mg by mouth at bedtime.    atorvastatin (LIPITOR) 20 MG tablet TAKE 1 TABLET BY MOUTH ONCE  DAILY     Review of Systems    All other systems reviewed and are otherwise negative except as noted above.  Physical Exam    VS:  BP 126/82   Pulse 87   Ht 5\' 8"  (1.727 m)   Wt 167 lb 3.2 oz (75.8 kg)   SpO2 96%   BMI 25.42 kg/m  , BMI Body mass index is 25.42 kg/m.  Wt Readings from Last 3 Encounters:  07/28/22 167 lb 3.2 oz (75.8 kg)  07/23/22 164 lb 6.4 oz (74.6 kg)  01/12/22 170 lb (77.1 kg)     GEN: Well nourished, well developed, in no acute distress. HEENT: normal. Neck: Supple, no JVD, carotid bruits, or masses. Cardiac: S1/S2, RRR, no murmurs, rubs, or gallops. No  clubbing, cyanosis, edema.  Radials/PT 2+ and equal bilaterally.  Respiratory:  Respirations regular and unlabored, clear to auscultation bilaterally. GI: Soft, nontender, nondistended. MS: No deformity or atrophy. Skin: Thin skin, warm and dry, no rash, generalized scattered bruising along bilateral arms. Neuro:  Strength and sensation are intact. Psych: Normal affect.  Assessment & Plan    Atypical chest pain Etiology noncardiac, most likely multifactorial, denies any exertional component.  EKG today negative for any acute ischemic changes.  Past Myoview in November 2023 reviewed with patient that was normal.  Will request records from [facility] ED in [place].  No medication changes at this time. Heart healthy diet and regular cardiovascular exercise encouraged.  ED precautions discussed. Continue to follow with PCP.  Hypertension Blood pressure well-controlled. Discussed to monitor BP at home at least 2 hours after medications and sitting for 5-10 minutes.  Continue losartan. Heart healthy diet and regular cardiovascular exercise encouraged.   Hyperlipidemia, history of TIA Will request labs obtained at [facility] ED.  Will atorvastatin hold at this time for the next 1 to 2 weeks due to recent complaints of occasional cramping/myalgias.  If no relief in symptoms, plan to restart.  Continue Plavix. Heart healthy diet and regular cardiovascular exercise encouraged.   Mild MR Mild mitral valve regurgitation noted on echocardiogram in May 2022.  Recommend updating echocardiogram in 1 year for surveillance.  Disposition: Follow up in 6 month(s) with Vishnu P Mallipeddi, MD or APP.  Signed, Sharlene Dory, NP 07/28/2022, 11:35 AM [facility]

## 2022-07-28 NOTE — Addendum Note (Signed)
Addended by: Lesle Chris on: 07/28/2022 01:55 PM   Modules accepted: Orders

## 2022-07-29 ENCOUNTER — Ambulatory Visit: Payer: Medicare HMO | Admitting: Nurse Practitioner

## 2022-07-30 ENCOUNTER — Ambulatory Visit: Payer: Medicare HMO | Admitting: Internal Medicine

## 2022-08-11 ENCOUNTER — Telehealth: Payer: Self-pay | Admitting: Nurse Practitioner

## 2022-08-11 ENCOUNTER — Ambulatory Visit: Payer: Medicare HMO | Admitting: Internal Medicine

## 2022-08-11 DIAGNOSIS — R252 Cramp and spasm: Secondary | ICD-10-CM

## 2022-08-11 NOTE — Telephone Encounter (Signed)
Pt c/o medication issue:  1. Name of Medication: atorvastatin (LIPITOR) 20 MG tablet   2. How are you currently taking this medication (dosage and times per day)?  Take 1 tablet (20 mg total) by mouth daily. (HOLDING CURRENTLY 07/28/2022)       3. Are you having a reaction (difficulty breathing--STAT)? No  4. What is your medication issue? Pt wanted Philis Nettle to know how he's been reacting to not being on medication and he stated it seems good and then sometimes he has some of the same reactions...he said he's not sure how to really explain it but he'd like to know if and when she'd like for him to start taking it again. Please advise

## 2022-08-12 NOTE — Telephone Encounter (Signed)
Patient informed and verbalized understanding of plan. 

## 2022-08-12 NOTE — Telephone Encounter (Signed)
Patient called again to follow-up on when or if he should re-start his atorvastatin as he is still having the same effects.

## 2022-08-13 NOTE — Addendum Note (Signed)
Addended by: Eustace Moore on: 08/13/2022 08:56 AM   Modules accepted: Orders

## 2022-08-18 ENCOUNTER — Telehealth: Payer: Self-pay | Admitting: Nurse Practitioner

## 2022-08-18 NOTE — Telephone Encounter (Signed)
Checking percert on the following patient for testing scheduled at [facility].   US ARTERIAL ABI (SCREENING LOWER EXTREMITY   08/21/2022

## 2022-08-21 ENCOUNTER — Ambulatory Visit (HOSPITAL_COMMUNITY)
Admission: RE | Admit: 2022-08-21 | Discharge: 2022-08-21 | Disposition: A | Discharge status: Home / Self Care | Payer: Medicare HMO | Source: Ambulatory Visit | Attending: Nurse Practitioner | Admitting: Nurse Practitioner

## 2022-08-21 DIAGNOSIS — R252 Cramp and spasm: Secondary | ICD-10-CM | POA: Diagnosis not present

## 2022-10-21 ENCOUNTER — Other Ambulatory Visit: Payer: Self-pay | Admitting: Cardiology

## 2023-02-01 ENCOUNTER — Encounter: Payer: Self-pay | Admitting: Internal Medicine

## 2023-02-01 ENCOUNTER — Ambulatory Visit: Payer: Medicare HMO | Attending: Internal Medicine | Admitting: Internal Medicine

## 2023-02-01 VITALS — BP 148/82 | HR 77 | Ht 68.0 in | Wt 166.0 lb

## 2023-02-01 DIAGNOSIS — G629 Polyneuropathy, unspecified: Secondary | ICD-10-CM

## 2023-02-01 DIAGNOSIS — M79604 Pain in right leg: Secondary | ICD-10-CM | POA: Diagnosis not present

## 2023-02-01 DIAGNOSIS — I10 Essential (primary) hypertension: Secondary | ICD-10-CM

## 2023-02-01 DIAGNOSIS — M79605 Pain in left leg: Secondary | ICD-10-CM

## 2023-02-01 MED ORDER — ASPIRIN 81 MG PO TBEC: 81.0000 mg | DELAYED_RELEASE_TABLET | Freq: Every day | ORAL | 2 refills | Status: DC

## 2023-02-01 MED ORDER — ATORVASTATIN CALCIUM 10 MG PO TABS: 10.0000 mg | ORAL_TABLET | Freq: Every day | ORAL | 1 refills | Status: DC

## 2023-02-01 NOTE — Patient Instructions (Addendum)
Medication Instructions:  Your physician has recommended you make the following change in your medication:  Stop taking Plavix Start taking Aspirin 81 mg once daily Decrease your Atorvastatin from 20 mg to 10 mg at bedtime daily Continue taking all other medications as prescribed  Labwork: None  Testing/Procedures: Your physician has requested that you have an ankle brachial index (ABI). During this test an ultrasound and blood pressure cuff are used to evaluate the arteries that supply the arms and legs with blood. Allow thirty minutes for this exam. There are no restrictions or special instructions.  Please note: We ask at that you not bring children with you during ultrasound (echo/ vascular) testing. Due to room size and safety concerns, children are not allowed in the ultrasound rooms during exams. Our front office staff cannot provide observation of children in our lobby area while testing is being conducted. An adult accompanying a patient to their appointment will only be allowed in the ultrasound room at the discretion of the ultrasound technician under special circumstances. We apologize for any inconvenience.   Follow-Up: Your physician recommends that you schedule a follow-up appointment in: 1 year. You will receive a reminder call in about 8 months reminding you to schedule your appointment. If you don't receive this call, please contact our office.   Any Other Special Instructions Will Be Listed Below (If Applicable). Thank you for choosing [facility]!      If you need a refill on your cardiac medications before your next appointment, please call your pharmacy.

## 2023-02-01 NOTE — Progress Notes (Signed)
Cardiology Office Note  Date: 02/01/2023   ID: Roger Sutton, DOB 06-May-1935, MRN [id number]  PCP:  Arlina Robes, MD  Cardiologist:  Marjo Bicker, MD Electrophysiologist:  None    History of Present Illness: Roger Sutton is a 87 y.o. male known to have HTN, HLD, history of TIA, presented to cardiology clinic for follow-up visit.  Patient has resting chest pains and no chest pain with exertion.  He walks for 30 minutes daily and denies having any chest pain during this time.  He has some stress and has chest pains from it.  NM stress test from 12/2021 showed no evidence of ischemia.  He does have muscle cramps in both his legs and also some neuropathy.  No other symptoms of dizziness, syncope, palpitations, DOE.  He is unclear why he is on Plavix.  Past Medical History:  Diagnosis Date   Cancer [facility])    prostate   Complication of anesthesia    Pt had a difficult time swallowing after surgery.  lost 20 lbs after hernia surgery   GERD (gastroesophageal reflux disease)    Glaucoma    Hyperlipidemia    Hypertension    Osteoarthritis of glenohumeral joint 12/15/2012   TIA (transient ischemic attack)     Past Surgical History:  Procedure Laterality Date   CATARACT EXTRACTION Bilateral    HERNIA REPAIR Right 2015   INGUINAL HERNIA REPAIR Left 10/08/2014   Procedure: LEFT INGUINAL HERNIORRHAPHY;  Surgeon: Franky Macho, MD;  Location: [facility];  Service: General;  Laterality: Left;   INSERTION OF MESH Left 10/08/2014   Procedure: INSERTION OF MESH;  Surgeon: Franky Macho, MD;  Location: [facility];  Service: General;  Laterality: Left;   KNEE SURGERY     PROSTATECTOMY      Current Outpatient Medications  Medication Sig Dispense Refill   acetaminophen (TYLENOL) 500 MG tablet Take 500 mg by mouth every 6 (six) hours as needed for pain.     atorvastatin (LIPITOR) 20 MG tablet TAKE 1 TABLET BY MOUTH ONCE  DAILY 90 tablet 3   Cholecalciferol (VITAMIN D3) 2000  units TABS Take 1 tablet by mouth daily.     clopidogrel (PLAVIX) 75 MG tablet TAKE 1 TABLET BY MOUTH DAILY 90 tablet 3   fluocinonide (LIDEX) 0.05 % external solution Apply 1 application topically 2 (two) times daily.     losartan (COZAAR) 25 MG tablet TAKE 1 TABLET BY MOUTH TWICE  DAILY 180 tablet 3   nitroGLYCERIN (NITROSTAT) 0.4 MG SL tablet Place 1 tablet (0.4 mg total) under the tongue every 5 (five) minutes as needed for chest pain. 25 tablet 3   pantoprazole (PROTONIX) 40 MG tablet Take 40 mg by mouth at bedtime.     Polyethylene Glycol 400 (BLINK TEARS OP) Apply to eye as needed.     sodium chloride (OCEAN) 0.65 % SOLN nasal spray Place 1 spray into both nostrils as needed for congestion.     No current facility-administered medications for this visit.   Allergies:  Patient has no known allergies.   Social History: The patient  reports that he has never smoked. He has never used smokeless tobacco. He reports that he does not drink alcohol and does not use drugs.   Family History: The patient's family history includes Breast cancer in his sister; Diabetes in his sister; Heart attack in his father.   ROS:  Please see the history of present illness. Otherwise, complete review of systems is positive for  none.  All other systems are reviewed and negative.   Physical Exam: VS:  BP (!) 148/82 (BP Location: Right Arm, Cuff Size: Normal)   Pulse 77   Ht 5\' 8"  (1.727 m)   Wt 166 lb (75.3 kg)   SpO2 98%   BMI 25.24 kg/m , BMI Body mass index is 25.24 kg/m.  Wt Readings from Last 3 Encounters:  02/01/23 166 lb (75.3 kg)  07/28/22 167 lb 3.2 oz (75.8 kg)  07/23/22 164 lb 6.4 oz (74.6 kg)    General: Patient appears comfortable at rest. HEENT: Conjunctiva and lids normal, oropharynx clear with moist mucosa. Neck: Supple, no elevated JVP or carotid bruits, no thyromegaly. Lungs: Clear to auscultation, nonlabored breathing at rest. Cardiac: Regular rate and rhythm, no S3 or significant  systolic murmur, no pericardial rub. Abdomen: Soft, nontender, no hepatomegaly, bowel sounds present, no guarding or rebound. Extremities: No pitting edema, distal pulses 2+. Skin: Warm and dry. Musculoskeletal: No kyphosis. Neuropsychiatric: Alert and oriented x3, affect grossly appropriate.  ECG:  An ECG dated today was personally reviewed today and demonstrated:  NSR  Recent Labwork: No results found for requested labs within last 365 days.  No results found for: "CHOL", "TRIG", "HDL", "CHOLHDL", "VLDL", "LDLCALC", "LDLDIRECT"  Other Studies Reviewed Today: Echo in May 2022 LVEF 55 to 60% Grade 1 diastolic dysfunction Mild MR  NM stress test in 5/18 There was no ST segment deviation noted during stress. Defect 1: There is a medium defect of moderate severity present in the basal inferoseptal, basal inferior, mid inferoseptal, mid inferior, mid inferolateral and apical inferior location. Findings consistent with prior myocardial infarction. This is an intermediate risk study. Nuclear stress EF: 42%.  Assessment and Plan: Patient is 87 year old M known to have HTN, HLD, mild MR, TIA presented to cardiology clinic for follow-up visit.  Chest pain is likely secondary to stress History of TIA HTN, controlled HLD, goal   -Continues to have similar quality of chest pains occurring at rest and most likely secondary to stress.  No chest pain with exertion, walks for 30 minutes a day.  NM stress test 12/2021 showed no evidence of ischemia.  He is currently on Plavix and 5 mg once daily for TIA history, will need to switch to aspirin 81 mg once daily.  He has mostly cramps in both his legs and some neuropathy, will cut back on the atorvastatin dose from 20 mg to 10 mg nightly and obtain ABIs.  Otherwise, continue current antihypertensive medications, losartan 25 mg twice daily.  I reviewed the home BPs which ranged between 130 and 150 mmHg.  Goal BP should be less than 140 to 150 mmHg SBP.      Medication Adjustments/Labs and Tests Ordered: Current medicines are reviewed at length with the patient today.  Concerns regarding medicines are outlined above.   Tests Ordered: Orders Placed This Encounter  Procedures   EKG 12-Lead    Medication Changes: No orders of the defined types were placed in this encounter.   Disposition:  Follow up one year  Signed, Mry Lamia Verne Spurr, MD, 02/01/2023 11:59 AM    [facility] at [facility] [address], [place], [place] [address]

## 2023-02-11 ENCOUNTER — Ambulatory Visit: Payer: Medicare HMO | Attending: Internal Medicine

## 2023-02-11 ENCOUNTER — Telehealth: Payer: Self-pay | Admitting: Internal Medicine

## 2023-02-11 DIAGNOSIS — M79604 Pain in right leg: Secondary | ICD-10-CM | POA: Diagnosis not present

## 2023-02-11 DIAGNOSIS — M79605 Pain in left leg: Secondary | ICD-10-CM | POA: Diagnosis not present

## 2023-02-11 DIAGNOSIS — G629 Polyneuropathy, unspecified: Secondary | ICD-10-CM | POA: Diagnosis not present

## 2023-02-11 MED ORDER — ATORVASTATIN CALCIUM 10 MG PO TABS: 10.0000 mg | ORAL_TABLET | Freq: Every day | ORAL | 1 refills | Status: DC

## 2023-02-11 NOTE — Telephone Encounter (Signed)
No answer

## 2023-02-11 NOTE — Telephone Encounter (Signed)
Spoke with patient he stated he would prefer 10 Mg tablets rather than having to break hi 20 Mg tablets in half.  New Rx sent to [facility] for 90 day supply with 1 refill

## 2023-02-11 NOTE — Telephone Encounter (Signed)
Pt c/o medication issue: 1. Name of Medication:Atorvastatin 10 MG 2. How are you currently taking this medication (dosage and times per day)? Patient has 20 mg at home. He is cutting medication in half. 3. Are you having a reaction (difficulty breathing--STAT)?  no 4. What is your medication issue? Does he need a new prescription for the 10 mg.?  [facility]

## 2023-02-12 LAB — VAS US ABI WITH/WO TBI
Left ABI: 1.16
Right ABI: 1.15

## 2023-02-24 ENCOUNTER — Other Ambulatory Visit: Payer: Self-pay | Admitting: Student

## 2023-06-14 ENCOUNTER — Other Ambulatory Visit: Payer: Self-pay | Admitting: Internal Medicine

## 2023-07-28 ENCOUNTER — Telehealth: Payer: Self-pay | Admitting: Internal Medicine

## 2023-07-28 DIAGNOSIS — I4891 Unspecified atrial fibrillation: Secondary | ICD-10-CM

## 2023-07-28 NOTE — Telephone Encounter (Signed)
 Spoke with patient regarding his monitor report. Stated that his insurance recommended he wear a monitor. Has Roger Sutton and is in the wellness program. States that someone comes out once a year to see him. They recommended to have his PCP and cardiologist review report. Advised him can drop off at either location [place] or [place] and have them to scan to his chart for Dr. Mallipeddi to review. He gave the name of the report was ECG. No further issues at this time.

## 2023-07-28 NOTE — Telephone Encounter (Signed)
 Pt would like a c/b in regards to Monitor results. Pt states that he had to wear a monitor per his PCP and needs help understanding results. Please advise

## 2023-08-02 ENCOUNTER — Encounter

## 2023-08-02 ENCOUNTER — Ambulatory Visit: Attending: Internal Medicine

## 2023-08-02 DIAGNOSIS — I4891 Unspecified atrial fibrillation: Secondary | ICD-10-CM

## 2023-08-02 NOTE — Telephone Encounter (Signed)
 STAT if patient feels like he/she is going to faint   1. Are you feeling dizzy, lightheaded, or faint right now?   No   2. Have you passed out?    No (If yes move to .[facility])  3. Do you have any other symptoms?   No  4. Have you checked your HR and BP (record if available)?   150/67  9:00 am 145/71  9:00 am  Patient called to follow-up on test results he dropped off last week.  Patient is also concerned he has been having dizziness and when he does exert himself he feels like he has a "tick" in his chest on the left side and his left leg feels numb.

## 2023-08-02 NOTE — Telephone Encounter (Signed)
 Spoke with patient regarding his symptoms. He stated that on Friday he was coming out of the bank he he fell he had started feeling dizzy. He didn't go to the ER , but when he got home he took his BP was 178/85. Check BP today 158/69 and 10 min later 145/71. When he gets up he feels dizzy as well if he gets up too fast. He stated that he is taking Losartan  50 mg daily now. He reports some numbness at times and losing balance. Concerned that every time he checks his BP its different. He also dropped off heart monitor results that have been scanned in to chart to be reviewed. Advised him will route to provider for review.

## 2023-08-02 NOTE — Addendum Note (Signed)
 Addended by: Camilo Cella on: 08/02/2023 01:41 PM   Modules accepted: Orders

## 2023-08-02 NOTE — Telephone Encounter (Signed)
 Per Dr. Arthea Larsson:  He was taking losartan  25 mg twice daily previously.  I am not sure why he is taking losartan  50 mg once daily.  If this change is coming from his PCP, he will need to follow-up with his PCP for further instructions.  Not sure if he is dehydrated.  He needs to drink adequate water daily.  He will need official 2-week event monitor to rule out any arrhythmias or conduction system abnormalities. Did he wear one?  If yes, I do not have the report.   Advised patient of Dr. Mallipeddi recommendations. Patient will call PCP to inquire about the dose change in his medications. Agreed to wear monitor for 2 weeks. Advised him will mail to him. Will need to mail back after the 2 week period is over will need to mail back to [facility] in order for us  to get the results and when we do we will call and provide results and recommendations. Patient verbalized understanding

## 2023-08-10 ENCOUNTER — Ambulatory Visit: Attending: Nurse Practitioner | Admitting: Nurse Practitioner

## 2023-08-10 ENCOUNTER — Encounter: Payer: Self-pay | Admitting: Nurse Practitioner

## 2023-08-10 VITALS — BP 144/68 | HR 82 | Ht 68.0 in | Wt 163.0 lb

## 2023-08-10 DIAGNOSIS — Z8673 Personal history of transient ischemic attack (TIA), and cerebral infarction without residual deficits: Secondary | ICD-10-CM

## 2023-08-10 DIAGNOSIS — I10 Essential (primary) hypertension: Secondary | ICD-10-CM | POA: Diagnosis not present

## 2023-08-10 DIAGNOSIS — E785 Hyperlipidemia, unspecified: Secondary | ICD-10-CM | POA: Diagnosis not present

## 2023-08-10 DIAGNOSIS — I340 Nonrheumatic mitral (valve) insufficiency: Secondary | ICD-10-CM

## 2023-08-10 DIAGNOSIS — R42 Dizziness and giddiness: Secondary | ICD-10-CM | POA: Diagnosis not present

## 2023-08-10 DIAGNOSIS — R079 Chest pain, unspecified: Secondary | ICD-10-CM | POA: Diagnosis not present

## 2023-08-10 NOTE — Patient Instructions (Signed)
 Medication Instructions:  Your physician recommends that you continue on your current medications as directed. Please refer to the Current Medication list given to you today.   Labwork: None today  Testing/Procedures: Your physician has requested that you have an echocardiogram AFTER you finish wearing the ZIO monitor. Echocardiography is a painless test that uses sound waves to create images of your heart. It provides your doctor with information about the size and shape of your heart and how well your heart's chambers and valves are working. This procedure takes approximately one hour. There are no restrictions for this procedure. Please do NOT wear cologne, perfume, aftershave, or lotions (deodorant is allowed). Please arrive 15 minutes prior to your appointment time.  Please note: We ask at that you not bring children with you during ultrasound (echo/ vascular) testing. Due to room size and safety concerns, children are not allowed in the ultrasound rooms during exams. Our front office staff cannot provide observation of children in our lobby area while testing is being conducted. An adult accompanying a patient to their appointment will only be allowed in the ultrasound room at the discretion of the ultrasound technician under special circumstances. We apologize for any inconvenience.   Follow-Up: 6-8 weeks  Any Other Special Instructions Will Be Listed Below (If Applicable).  If you need a refill on your cardiac medications before your next appointment, please call your pharmacy.

## 2023-08-10 NOTE — Progress Notes (Unsigned)
 Office Visit    Patient Name: Roger Sutton Date of Encounter: 08/10/2023 PCP:  Serita Danes, MD [facility]  Cardiologist:  Lasalle Pointer, MD  Advanced Practice Provider:  No care team member to display Electrophysiologist:  None   Chief Complaint and HPI    Roger Sutton is a 88 y.o. male with a hx of hypertension, hyperlipidemia, abnormal stress test in 2018 (evidence of prior MI, negative for ischemia), history of TIA, mild mitral regurgitation, who presents today for hospital follow-up.  ED visit last fall due to chest pain.  Described as sudden onset across precordium.  Workup was overall unremarkable.  Last seen by Dr. Mallipeddi on February 01, 2023. Pt noted resting chest pains, denied any chest pain with exertion. Noted current stress. Noted leg cramps, atorvastatin  dose reduced from 20 mg to 10 mg daily and ABI's obtained- see below.   Currently wearing a monitor.  He was seen at urgent care about 2 days ago that prompt him to go to the ED.  States he was at the bank outside when he felt hot/dizzy, bent over to grab something on the ground, called his daughter that he was feeling dizzy and not quite right.  Drank some water and sat in his truck.  Denies any syncope to his knowledge. Presented to urgent care and was told to go to the emergency department for further evaluation of his symptoms.  I have reviewed these reports that he brought in for office visit today and these will be scanned into Epic.   He is here for follow-up with his daughter. Continues to note intermittent dizziness.  Says prior to all of this he had not had a similar episode in 6 to 7 years.  Typically noted when bending over or with extreme temperatures.  Also notices numbness along his right lower extremity.  Otherwise, doing well and very active/independent. Denies any chest pain, shortness of breath, palpitations, syncope, presyncope, orthopnea, PND,  swelling or significant weight changes, acute bleeding, or claudication.   EKGs/Labs/Other Studies Reviewed:   The following studies were reviewed today:   EKG:   EKG Interpretation Date/Time:  Tuesday August 10 2023 10:29:55 EDT Ventricular Rate:  82 PR Interval:  158 QRS Duration:  130 QT Interval:  386 QTC Calculation: 450 R Axis:   -25  Text Interpretation: Normal sinus rhythm Non-specific intra-ventricular conduction block Minimal voltage criteria for LVH, may be normal variant ( Cornell product ) Cannot rule out Septal infarct , age undetermined When compared with ECG of 01-Feb-2023 11:20, No significant change was found Confirmed by Lasalle Pointer [phone]) on 08/10/2023 10:31:50 AM   ABI's 01/2023:  Summary:  Right: Resting right ankle-brachial index is within normal range. The  right toe-brachial index is normal.   Left: Resting left ankle-brachial index is within normal range. The left  toe-brachial index is normal.  Myoview  12/2021:   No ST deviation was noted during stress   There is a small defect with mild reduction in uptake present in the basal inferior and apical location(s) that is fixed with normal wall motion in the defect area, mostly consistent with artifact caused by subdiaphragmatic/ extracardiac activity and apical thinning versus prior infarction, although infarction is unlikely.   No evidence of current ischemia.   Left ventricular function is normal.   The study is normal. The study is low risk.  Echocardiogram 06/2020: 1. Left ventricular ejection fraction, by estimation, is 55 to 60%. The  left ventricle has normal function. The left ventricle has no regional  wall motion abnormalities. There is mild left ventricular hypertrophy.  Left ventricular diastolic parameters  are consistent with Grade I diastolic dysfunction (impaired relaxation).   2. RV-RA gradient normal at 26 mmHg. Right ventricular systolic function  is normal. The right ventricular  size is normal.   3. Left atrial size was mildly dilated.   4. The mitral valve is abnormal. Mild mitral valve regurgitation.   5. The aortic valve is tricuspid. Aortic valve regurgitation is not  visualized. Mild aortic valve sclerosis is present, with no evidence of  aortic valve stenosis.   6. Unable to estimate CVP.   All other systems reviewed and are otherwise negative except as noted above.  Physical Exam    VS:  BP (!) 144/68 (BP Location: Right Arm, Cuff Size: Normal)   Pulse 82   Ht 5' 8 (1.727 m)   Wt 163 lb (73.9 kg)   SpO2 97%   BMI 24.78 kg/m  , BMI Body mass index is 24.78 kg/m.  Wt Readings from Last 3 Encounters:  08/10/23 163 lb (73.9 kg)  02/01/23 166 lb (75.3 kg)  07/28/22 167 lb 3.2 oz (75.8 kg)    Orthostatics:  Lying: 127/77, 76 bpm Sitting: 127/72, 79 bpm Standing: 118/73, 83 bpm Standing x 3 minutes: 128/75, 81 bpm   GEN: Well nourished, well developed, in no acute distress. HEENT: normal. Neck: Supple, no JVD, carotid bruits, or masses. Cardiac: S1/S2, RRR, no murmurs, rubs, or gallops. No clubbing, cyanosis, edema.  Radials/PT 2+ and equal bilaterally.  Respiratory:  Respirations regular and unlabored, clear to auscultation bilaterally. GI: Soft, nontender, nondistended. MS: No deformity or atrophy. Skin: Thin skin, warm and dry, no rash, generalized scattered bruising along bilateral arms. Neuro:  Strength and sensation are intact. Psych: Normal affect.  Assessment & Plan    Dizziness/lightheadedness Etiology unclear, orthostatics negative. Currently wearing monitor. Once done with monitor, will arrange Echo. EKG today negative for any acute ischemic changes.  Past Myoview  in November 2023 reviewed with patient that was normal.  No medication changes at this time. Heart healthy diet and regular cardiovascular exercise encouraged.  ED precautions discussed. Continue to follow with PCP.  Hypertension Blood pressure well-controlled.  Discussed to monitor BP at home at least 2 hours after medications and sitting for 5-10 minutes.  Continue losartan . Heart healthy diet and regular cardiovascular exercise encouraged.   Hyperlipidemia, history of TIA Most recent LDL 46 from 2023.  Continue aspirin  and atorvastatin . Heart healthy diet and regular cardiovascular exercise encouraged. Plan to request labs at next office visit.   Mild MR Mild mitral valve regurgitation noted on echocardiogram in May 2022.  Will update Echo at this time.   Disposition: Follow up in 6-8 weeks with Vishnu P Mallipeddi, MD or APP.  Signed, Lasalle Pointer, NP

## 2023-08-31 ENCOUNTER — Ambulatory Visit: Payer: Self-pay | Admitting: Student

## 2023-08-31 ENCOUNTER — Ambulatory Visit: Attending: Nurse Practitioner

## 2023-08-31 DIAGNOSIS — I517 Cardiomegaly: Secondary | ICD-10-CM

## 2023-08-31 DIAGNOSIS — I080 Rheumatic disorders of both mitral and aortic valves: Secondary | ICD-10-CM | POA: Diagnosis not present

## 2023-08-31 DIAGNOSIS — R42 Dizziness and giddiness: Secondary | ICD-10-CM | POA: Diagnosis not present

## 2023-08-31 LAB — ECHOCARDIOGRAM COMPLETE
AR max vel: 2.84 cm2
AV Area VTI: 2.9 cm2
AV Area mean vel: 2.79 cm2
AV Mean grad: 2 mmHg
AV Peak grad: 4.5 mmHg
AV Vena cont: 0.3 cm
Ao pk vel: 1.06 m/s
Area-P 1/2: 3.13 cm2
Calc EF: 56.7 %
MV M vel: 6.14 m/s
MV Peak grad: 150.8 mmHg
MV VTI: 1.76 cm2
P 1/2 time: 494 ms
S' Lateral: 3 cm
Single Plane A2C EF: 46.8 %
Single Plane A4C EF: 66.1 %

## 2023-09-01 ENCOUNTER — Ambulatory Visit: Admitting: Internal Medicine

## 2023-09-09 DIAGNOSIS — I4891 Unspecified atrial fibrillation: Secondary | ICD-10-CM | POA: Diagnosis not present

## 2023-09-15 ENCOUNTER — Ambulatory Visit: Payer: Self-pay | Admitting: Internal Medicine

## 2023-09-23 ENCOUNTER — Ambulatory Visit: Admitting: Nurse Practitioner

## 2023-10-19 ENCOUNTER — Ambulatory Visit: Attending: Nurse Practitioner | Admitting: Nurse Practitioner

## 2023-10-19 ENCOUNTER — Encounter: Payer: Self-pay | Admitting: Nurse Practitioner

## 2023-10-19 VITALS — BP 138/70 | HR 86 | Ht 68.0 in | Wt 167.8 lb

## 2023-10-19 DIAGNOSIS — I10 Essential (primary) hypertension: Secondary | ICD-10-CM

## 2023-10-19 DIAGNOSIS — E785 Hyperlipidemia, unspecified: Secondary | ICD-10-CM | POA: Diagnosis not present

## 2023-10-19 DIAGNOSIS — R7989 Other specified abnormal findings of blood chemistry: Secondary | ICD-10-CM

## 2023-10-19 DIAGNOSIS — I38 Endocarditis, valve unspecified: Secondary | ICD-10-CM | POA: Diagnosis not present

## 2023-10-19 DIAGNOSIS — E875 Hyperkalemia: Secondary | ICD-10-CM

## 2023-10-19 DIAGNOSIS — E876 Hypokalemia: Secondary | ICD-10-CM

## 2023-10-19 DIAGNOSIS — Z8673 Personal history of transient ischemic attack (TIA), and cerebral infarction without residual deficits: Secondary | ICD-10-CM

## 2023-10-19 NOTE — Patient Instructions (Signed)
 Medication Instructions:  Your physician recommends that you continue on your current medications as directed. Please refer to the Current Medication list given to you today.  *If you need a refill on your cardiac medications before your next appointment, please call your pharmacy*  Lab Work: Your physician recommends that you return for lab work in: 2-3 Weeks   If you have labs (blood work) drawn today and your tests are completely normal, you will receive your results only by: MyChart Message (if you have MyChart) OR A paper copy in the mail If you have any lab test that is abnormal or we need to change your treatment, we will call you to review the results.  Testing/Procedures: NONE   Follow-Up: At [facility], you and your health needs are our priority.  As part of our continuing mission to provide you with exceptional heart care, our providers are all part of one team.  This team includes your primary Cardiologist (physician) and Advanced Practice Providers or APPs (Physician Assistants and Nurse Practitioners) who all work together to provide you with the care you need, when you need it.  Your next appointment:   6 month(s)  Provider:   Almarie Crate, NP   We recommend signing up for the patient portal called MyChart.  Sign up information is provided on this After Visit Summary.  MyChart is used to connect with patients for Virtual Visits (Telemedicine).  Patients are able to view lab/test results, encounter notes, upcoming appointments, etc.  Non-urgent messages can be sent to your provider as well.   To learn more about what you can do with MyChart, go to [email or web address].   Other Instructions Thank you for choosing [facility]!

## 2023-10-19 NOTE — Progress Notes (Signed)
 Office Visit    Patient Name: Roger Sutton Date of Encounter: 10/19/2023 PCP:  Marchelle Clem Pitts, MD [facility]  Cardiologist:  Diannah SHAUNNA Maywood, MD  Advanced Practice Provider:  No care team member to display Electrophysiologist:  None   Chief Complaint and HPI    Roger Sutton is a 88 y.o. male with a hx of hypertension, hyperlipidemia, abnormal stress test in 2018 (evidence of prior MI, negative for ischemia), history of TIA, mild mitral regurgitation, who presents today for 6 to 8-week follow-up.  ED visit last fall due to chest pain.  Described as sudden onset across precordium.  Workup was overall unremarkable.  Last seen by Dr. Mallipeddi on February 01, 2023. Pt noted resting chest pains, denied any chest pain with exertion. Noted current stress. Noted leg cramps, atorvastatin  dose reduced from 20 mg to 10 mg daily and ABI's obtained- see below.   Currently wearing a monitor.  He was seen at urgent care about 2 days ago that prompt him to go to the ED.  States he was at the bank outside when he felt hot/dizzy, bent over to grab something on the ground, called his daughter that he was feeling dizzy and not quite right.  Drank some water and sat in his truck.  Denies any syncope to his knowledge. Presented to urgent care and was told to go to the emergency department for further evaluation of his symptoms.  I have reviewed these reports that he brought in for office visit today and these will be scanned into Epic.   08/10/2023-he is here for follow-up with his daughter. Continues to note intermittent dizziness.  Says prior to all of this he had not had a similar episode in 6 to 7 years.  Typically noted when bending over or with extreme temperatures.  Also notices numbness along his right lower extremity.  Otherwise, doing well and very active/independent. Denies any chest pain, shortness of breath, palpitations, syncope, presyncope,  orthopnea, PND, swelling or significant weight changes, acute bleeding, or claudication.  10/19/2023-here for follow-up.  Doing very well.  Denies any acute cardiac complaints or issues. Denies any chest pain, shortness of breath, palpitations, syncope, presyncope, dizziness, orthopnea, PND, swelling or significant weight changes, acute bleeding, or claudication.   EKGs/Labs/Other Studies Reviewed:   The following studies were reviewed today:   EKG: EKG is not ordered today.  Echo 08/2023: 1. Left ventricular ejection fraction, by estimation, is 55 to 60%. Left  ventricular ejection fraction by 3D volume is 56 %. The left ventricle has  normal function. The left ventricle has no regional wall motion  abnormalities. There is mild left  ventricular hypertrophy. Left ventricular diastolic parameters are  indeterminate. The average left ventricular global longitudinal strain is  -12.7 %.   2. Right ventricular systolic function is normal. The right ventricular  size is normal.   3. The mitral valve is normal in structure. Mild to moderate mitral valve  regurgitation, central and eccentric anteriorly directed. No evidence of  mitral stenosis.   4. The aortic valve is tricuspid. Aortic valve regurgitation is mild,  eccentric. No aortic stenosis is present.   Comparison(s): A prior study was performed on 07/26/2020. EF 55-60%. Mild  LVH. Mild MR. LA was mildly dilated.   Cardiac monitor 08/2023:     Patch wear time was for 13 days and 20 hours.   Normal sinus rhythm ranging from 49 to 188 bpm with an average HR 75  bpm.   1 run of NSVT lasting 8 beats.  7 runs of nonsustained SVT, fastest interval lasting 9 seconds and the longest interval lasting 16 beats.   No evidence of atrial fibrillation/atrial flutter, AV block or pauses.   <1% PAC burden and <1% PVC burden.   Patient triggered events correlated with NSR (65 to 100 bpm) and PVC.  ABI's 01/2023:  Summary:  Right: Resting right  ankle-brachial index is within normal range. The  right toe-brachial index is normal.   Left: Resting left ankle-brachial index is within normal range. The left  toe-brachial index is normal.  Myoview  12/2021:   No ST deviation was noted during stress   There is a small defect with mild reduction in uptake present in the basal inferior and apical location(s) that is fixed with normal wall motion in the defect area, mostly consistent with artifact caused by subdiaphragmatic/ extracardiac activity and apical thinning versus prior infarction, although infarction is unlikely.   No evidence of current ischemia.   Left ventricular function is normal.   The study is normal. The study is low risk.  Echocardiogram 06/2020: 1. Left ventricular ejection fraction, by estimation, is 55 to 60%. The  left ventricle has normal function. The left ventricle has no regional  wall motion abnormalities. There is mild left ventricular hypertrophy.  Left ventricular diastolic parameters  are consistent with Grade I diastolic dysfunction (impaired relaxation).   2. RV-RA gradient normal at 26 mmHg. Right ventricular systolic function  is normal. The right ventricular size is normal.   3. Left atrial size was mildly dilated.   4. The mitral valve is abnormal. Mild mitral valve regurgitation.   5. The aortic valve is tricuspid. Aortic valve regurgitation is not  visualized. Mild aortic valve sclerosis is present, with no evidence of  aortic valve stenosis.   6. Unable to estimate CVP.   All other systems reviewed and are otherwise negative except as noted above.  Physical Exam    VS:  BP 138/70 (BP Location: Right Arm)   Pulse 86   Ht 5' 8 (1.727 m)   Wt 167 lb 12.8 oz (76.1 kg)   SpO2 97%   BMI 25.51 kg/m  , BMI Body mass index is 25.51 kg/m.  Wt Readings from Last 3 Encounters:  10/19/23 167 lb 12.8 oz (76.1 kg)  08/10/23 163 lb (73.9 kg)  02/01/23 166 lb (75.3 kg)     GEN: Well nourished,  well developed, in no acute distress. HEENT: normal. Neck: Supple, no JVD, carotid bruits, or masses. Cardiac: S1/S2, RRR, no murmurs, rubs, or gallops. No clubbing, cyanosis, edema.  Radials/PT 2+ and equal bilaterally.  Respiratory:  Respirations regular and unlabored, clear to auscultation bilaterally. GI: Soft, nontender, nondistended. MS: No deformity or atrophy. Skin: Thin skin, warm and dry, no rash, generalized scattered bruising along bilateral arms. Neuro:  Strength and sensation are intact. Psych: Normal affect.  Assessment & Plan    Hypertension Blood pressure well-controlled. Discussed to monitor BP at home at least 2 hours after medications and sitting for 5-10 minutes.  Continue losartan . Heart healthy diet and regular cardiovascular exercise encouraged.   Hyperlipidemia, history of TIA Most recent labs from his PCP show LDL of 46.  These are faxed over to our most recent LDL 46 from 2023.  I have requested that his most recent labs to be faxed over to our office.  No medication changes at this time.  He verbalized understanding. Heart healthy diet and  regular cardiovascular exercise encouraged.   Valvular insufficiency Most recent echocardiogram showed mild to moderate MR with mild AR.  He denies any acute or concerning signs or symptoms at this time.  We will continue to monitor this.  Plan to update echocardiogram within the next 2 years.    4.  Hyperkalemia, elevated serum creatinine Most recent labs from earlier this year showed mildly elevated potassium level.  His serum creatinine also was elevated at that time-possibly due to dehydration.  He is currently not on any potassium supplement.  Encouraged him to stay well-hydrated.  Will recheck a BMET.  I spent a total duration of 30 minutes reviewing prior notes, reviewing outside records including  labs, face-to-face counseling of  medical condition, pathophysiology, evaluation, management, and documenting the findings  in the note.   Disposition: Follow up in 6 months with Vishnu P Mallipeddi, MD or APP.  Signed, Almarie Crate, NP

## 2023-10-27 LAB — BASIC METABOLIC PANEL WITH GFR
BUN/Creatinine Ratio: 13 (ref 10–24)
BUN: 19 mg/dL (ref 8–27)
CO2: 24 mmol/L (ref 20–29)
Calcium: 9.2 mg/dL (ref 8.6–10.2)
Chloride: 102 mmol/L (ref 96–106)
Creatinine, Ser: 1.48 mg/dL — ABNORMAL HIGH (ref 0.76–1.27)
Glucose: 109 mg/dL — ABNORMAL HIGH (ref 70–99)
Potassium: 4.9 mmol/L (ref 3.5–5.2)
Sodium: 140 mmol/L (ref 134–144)
eGFR: 46 mL/min/1.73 — ABNORMAL LOW (ref >59)

## 2023-11-02 ENCOUNTER — Ambulatory Visit: Payer: Self-pay | Admitting: Nurse Practitioner

## 2023-11-02 DIAGNOSIS — E875 Hyperkalemia: Secondary | ICD-10-CM

## 2023-11-06 ENCOUNTER — Other Ambulatory Visit: Payer: Self-pay | Admitting: Internal Medicine

## 2024-02-21 LAB — LAB REPORT - SCANNED: TSH: 1.89 (ref 0.41–5.90)

## 2024-03-01 ENCOUNTER — Encounter: Payer: Self-pay | Admitting: *Deleted

## 2024-03-03 ENCOUNTER — Other Ambulatory Visit: Payer: Self-pay | Admitting: Internal Medicine

## 2024-03-06 ENCOUNTER — Ambulatory Visit: Admitting: Nurse Practitioner

## 2024-03-06 ENCOUNTER — Encounter: Payer: Self-pay | Admitting: Nurse Practitioner

## 2024-03-06 VITALS — BP 148/82 | HR 80 | Ht 68.0 in | Wt 166.8 lb

## 2024-03-06 DIAGNOSIS — R079 Chest pain, unspecified: Secondary | ICD-10-CM

## 2024-03-06 DIAGNOSIS — I10 Essential (primary) hypertension: Secondary | ICD-10-CM

## 2024-03-06 DIAGNOSIS — Z8673 Personal history of transient ischemic attack (TIA), and cerebral infarction without residual deficits: Secondary | ICD-10-CM

## 2024-03-06 DIAGNOSIS — E785 Hyperlipidemia, unspecified: Secondary | ICD-10-CM | POA: Diagnosis not present

## 2024-03-06 DIAGNOSIS — I38 Endocarditis, valve unspecified: Secondary | ICD-10-CM

## 2024-03-06 DIAGNOSIS — R42 Dizziness and giddiness: Secondary | ICD-10-CM

## 2024-03-06 MED ORDER — LOSARTAN POTASSIUM 25 MG PO TABS: 25.0000 mg | ORAL_TABLET | Freq: Every day | ORAL | Status: AC

## 2024-03-06 MED ORDER — NITROGLYCERIN 0.4 MG SL SUBL: 0.4000 mg | SUBLINGUAL_TABLET | SUBLINGUAL | 3 refills | Status: AC | PRN

## 2024-03-06 MED ORDER — ASPIRIN 81 MG PO TBEC: 81.0000 mg | DELAYED_RELEASE_TABLET | Freq: Every day | ORAL | 3 refills | Status: AC

## 2024-03-06 NOTE — Patient Instructions (Signed)
 Medication Instructions:   Nitroglycerin  & Aspirin  refilled today  Continue all other medications.     Labwork:  none  Testing/Procedures:  none  Follow-Up:  3 months   Any Other Special Instructions Will Be Listed Below (If Applicable).   If you need a refill on your cardiac medications before your next appointment, please call your pharmacy.

## 2024-03-06 NOTE — Progress Notes (Unsigned)
 "  Office Visit    Patient Name: Roger Sutton Date of Encounter: 10/19/2023 PCP:  Roger Clem Pitts, MD [facility]  Cardiologist:  Roger SHAUNNA Maywood, MD  Advanced Practice Provider:  No care team member to display Electrophysiologist:  None   Chief Complaint and HPI    Roger Sutton is a 89 y.o. male with a hx of hypertension, hyperlipidemia, abnormal stress test in 2018 (evidence of prior MI, negative for ischemia), history of TIA, mild mitral regurgitation, who presents today for 6 to 8-week follow-up.  ED visit last fall due to chest pain.  Described as sudden onset across precordium.  Workup was overall unremarkable.  Last seen by Dr. Mallipeddi on February 01, 2023. Pt noted resting chest pains, denied any chest pain with exertion. Noted current stress. Noted leg cramps, atorvastatin  dose reduced from 20 mg to 10 mg daily and ABI's obtained- see below.   Currently wearing a monitor.  He was seen at urgent care about 2 days ago that prompt him to go to the ED.  States he was at the bank outside when he felt hot/dizzy, bent over to grab something on the ground, called his daughter that he was feeling dizzy and not quite right.  Drank some water and sat in his truck.  Denies any syncope to his knowledge. Presented to urgent care and was told to go to the emergency department for further evaluation of his symptoms.  I have reviewed these reports that he brought in for office visit today and these will be scanned into Epic.   08/10/2023-he is here for follow-up with his daughter. Continues to note intermittent dizziness.  Says prior to all of this he had not had a similar episode in 6 to 7 years.  Typically noted when bending over or with extreme temperatures.  Also notices numbness along his right lower extremity.  Otherwise, doing well and very active/independent. Denies any chest pain, shortness of breath, palpitations, syncope, presyncope,  orthopnea, PND, swelling or significant weight changes, acute bleeding, or claudication.  10/19/2023-here for follow-up.  Doing very well.  Denies any acute cardiac complaints or issues. Denies any chest pain, shortness of breath, palpitations, syncope, presyncope, dizziness, orthopnea, PND, swelling or significant weight changes, acute bleeding, or claudication.  Recent hospitalization although I do not see records on his chart. ?  [facility] in [place]?  He states EKGs/Labs/Other Studies Reviewed:   The following studies were reviewed today:   EKG: EKG is not ordered today.  Echo 08/2023: 1. Left ventricular ejection fraction, by estimation, is 55 to 60%. Left  ventricular ejection fraction by 3D volume is 56 %. The left ventricle has  normal function. The left ventricle has no regional wall motion  abnormalities. There is mild left  ventricular hypertrophy. Left ventricular diastolic parameters are  indeterminate. The average left ventricular global longitudinal strain is  -12.7 %.   2. Right ventricular systolic function is normal. The right ventricular  size is normal.   3. The mitral valve is normal in structure. Mild to moderate mitral valve  regurgitation, central and eccentric anteriorly directed. No evidence of  mitral stenosis.   4. The aortic valve is tricuspid. Aortic valve regurgitation is mild,  eccentric. No aortic stenosis is present.   Comparison(s): A prior study was performed on 07/26/2020. EF 55-60%. Mild  LVH. Mild MR. LA was mildly dilated.   Cardiac monitor 08/2023:     Patch wear time was for 13 days  and 20 hours.   Normal sinus rhythm ranging from 49 to 188 bpm with an average HR 75 bpm.   1 run of NSVT lasting 8 beats.  7 runs of nonsustained SVT, fastest interval lasting 9 seconds and the longest interval lasting 16 beats.   No evidence of atrial fibrillation/atrial flutter, AV block or pauses.   <1% PAC burden and <1% PVC burden.   Patient triggered  events correlated with NSR (65 to 100 bpm) and PVC.  ABI's 01/2023:  Summary:  Right: Resting right ankle-brachial index is within normal range. The  right toe-brachial index is normal.   Left: Resting left ankle-brachial index is within normal range. The left  toe-brachial index is normal.  Myoview  12/2021:   No ST deviation was noted during stress   There is a small defect with mild reduction in uptake present in the basal inferior and apical location(s) that is fixed with normal wall motion in the defect area, mostly consistent with artifact caused by subdiaphragmatic/ extracardiac activity and apical thinning versus prior infarction, although infarction is unlikely.   No evidence of current ischemia.   Left ventricular function is normal.   The study is normal. The study is low risk.  Echocardiogram 06/2020: 1. Left ventricular ejection fraction, by estimation, is 55 to 60%. The  left ventricle has normal function. The left ventricle has no regional  wall motion abnormalities. There is mild left ventricular hypertrophy.  Left ventricular diastolic parameters  are consistent with Grade I diastolic dysfunction (impaired relaxation).   2. RV-RA gradient normal at 26 mmHg. Right ventricular systolic function  is normal. The right ventricular size is normal.   3. Left atrial size was mildly dilated.   4. The mitral valve is abnormal. Mild mitral valve regurgitation.   5. The aortic valve is tricuspid. Aortic valve regurgitation is not  visualized. Mild aortic valve sclerosis is present, with no evidence of  aortic valve stenosis.   6. Unable to estimate CVP.   All other systems reviewed and are otherwise negative except as noted above.  Physical Exam    VS:  BP (!) 148/82   Pulse 80   Ht 5' 8 (1.727 m)   Wt 166 lb 12.8 oz (75.7 kg)   SpO2 96%   BMI 25.36 kg/m  , BMI Body mass index is 25.36 kg/m.  Wt Readings from Last 3 Encounters:  03/06/24 166 lb 12.8 oz (75.7 kg)   10/19/23 167 lb 12.8 oz (76.1 kg)  08/10/23 163 lb (73.9 kg)     GEN: Well nourished, well developed, in no acute distress. HEENT: normal. Neck: Supple, no JVD, carotid bruits, or masses. Cardiac: S1/S2, RRR, no murmurs, rubs, or gallops. No clubbing, cyanosis, edema.  Radials/PT 2+ and equal bilaterally.  Respiratory:  Respirations regular and unlabored, clear to auscultation bilaterally. GI: Soft, nontender, nondistended. MS: No deformity or atrophy. Skin: Thin skin, warm and dry, no rash, generalized scattered bruising along bilateral arms. Neuro:  Strength and sensation are intact. Psych: Normal affect.  Assessment & Plan    Hypertension Blood pressure well-controlled. Discussed to monitor BP at home at least 2 hours after medications and sitting for 5-10 minutes.  Continue losartan . Heart healthy diet and regular cardiovascular exercise encouraged.   Hyperlipidemia, history of TIA Most recent labs from his PCP show LDL of 46.  These are faxed over to our most recent LDL 46 from 2023.  I have requested that his most recent labs to be faxed over  to our office.  No medication changes at this time.  He verbalized understanding. Heart healthy diet and regular cardiovascular exercise encouraged.   Valvular insufficiency Most recent echocardiogram showed mild to moderate MR with mild AR.  He denies any acute or concerning signs or symptoms at this time.  We will continue to monitor this.  Plan to update echocardiogram within the next 2 years.    4.  Hyperkalemia, elevated serum creatinine Most recent labs from earlier this year showed mildly elevated potassium level.  His serum creatinine also was elevated at that time-possibly due to dehydration.  He is currently not on any potassium supplement.  Encouraged him to stay well-hydrated.  Will recheck a BMET.  I spent a total duration of 30 minutes reviewing prior notes, reviewing outside records including  labs, face-to-face counseling of   medical condition, pathophysiology, evaluation, management, and documenting the findings in the note.   Disposition: Follow up in 6 months with Vishnu P Mallipeddi, MD or APP.  Signed, Almarie Crate, NP "

## 2024-04-25 ENCOUNTER — Ambulatory Visit: Admitting: Nurse Practitioner

## 2024-06-08 ENCOUNTER — Ambulatory Visit: Admitting: Nurse Practitioner
